# Patient Record
Sex: Female | Born: 1975 | Race: Black or African American | Hispanic: No | Marital: Married | Smoking: Never smoker
Health system: Southern US, Community
[De-identification: ages and names within clinical notes are randomized; demographics above are authoritative.]

## PROBLEM LIST (undated history)

## (undated) DIAGNOSIS — R5383 Other fatigue: Secondary | ICD-10-CM

## (undated) DIAGNOSIS — M2540 Effusion, unspecified joint: Secondary | ICD-10-CM

## (undated) DIAGNOSIS — L509 Urticaria, unspecified: Secondary | ICD-10-CM

## (undated) DIAGNOSIS — R351 Nocturia: Secondary | ICD-10-CM

## (undated) DIAGNOSIS — J302 Other seasonal allergic rhinitis: Secondary | ICD-10-CM

## (undated) DIAGNOSIS — E039 Hypothyroidism, unspecified: Secondary | ICD-10-CM

## (undated) DIAGNOSIS — G43909 Migraine, unspecified, not intractable, without status migrainosus: Secondary | ICD-10-CM

## (undated) DIAGNOSIS — D8689 Sarcoidosis of other sites: Secondary | ICD-10-CM

## (undated) DIAGNOSIS — Z8489 Family history of other specified conditions: Secondary | ICD-10-CM

## (undated) DIAGNOSIS — N946 Dysmenorrhea, unspecified: Secondary | ICD-10-CM

## (undated) DIAGNOSIS — E559 Vitamin D deficiency, unspecified: Secondary | ICD-10-CM

## (undated) DIAGNOSIS — G479 Sleep disorder, unspecified: Secondary | ICD-10-CM

## (undated) DIAGNOSIS — F32A Depression, unspecified: Secondary | ICD-10-CM

## (undated) DIAGNOSIS — L940 Localized scleroderma [morphea]: Secondary | ICD-10-CM

## (undated) DIAGNOSIS — M810 Age-related osteoporosis without current pathological fracture: Secondary | ICD-10-CM

## (undated) DIAGNOSIS — R21 Rash and other nonspecific skin eruption: Secondary | ICD-10-CM

## (undated) DIAGNOSIS — R0602 Shortness of breath: Secondary | ICD-10-CM

## (undated) DIAGNOSIS — D869 Sarcoidosis, unspecified: Secondary | ICD-10-CM

## (undated) DIAGNOSIS — J189 Pneumonia, unspecified organism: Secondary | ICD-10-CM

## (undated) DIAGNOSIS — Z8739 Personal history of other diseases of the musculoskeletal system and connective tissue: Secondary | ICD-10-CM

## (undated) DIAGNOSIS — K219 Gastro-esophageal reflux disease without esophagitis: Secondary | ICD-10-CM

## (undated) DIAGNOSIS — D649 Anemia, unspecified: Secondary | ICD-10-CM

## (undated) DIAGNOSIS — Z973 Presence of spectacles and contact lenses: Secondary | ICD-10-CM

## (undated) DIAGNOSIS — R011 Cardiac murmur, unspecified: Secondary | ICD-10-CM

## (undated) DIAGNOSIS — F329 Major depressive disorder, single episode, unspecified: Secondary | ICD-10-CM

## (undated) DIAGNOSIS — M2550 Pain in unspecified joint: Secondary | ICD-10-CM

## (undated) DIAGNOSIS — G373 Acute transverse myelitis in demyelinating disease of central nervous system: Secondary | ICD-10-CM

## (undated) DIAGNOSIS — T7840XA Allergy, unspecified, initial encounter: Secondary | ICD-10-CM

## (undated) DIAGNOSIS — N939 Abnormal uterine and vaginal bleeding, unspecified: Secondary | ICD-10-CM

## (undated) DIAGNOSIS — R51 Headache: Secondary | ICD-10-CM

## (undated) DIAGNOSIS — D509 Iron deficiency anemia, unspecified: Secondary | ICD-10-CM

## (undated) HISTORY — DX: Sleep disorder, unspecified: G47.9

## (undated) HISTORY — DX: Pain in unspecified joint: M25.50

## (undated) HISTORY — DX: Urticaria, unspecified: L50.9

## (undated) HISTORY — DX: Age-related osteoporosis without current pathological fracture: M81.0

## (undated) HISTORY — DX: Vitamin D deficiency, unspecified: E55.9

## (undated) HISTORY — PX: ESOPHAGOGASTRODUODENOSCOPY: SHX1529

## (undated) HISTORY — DX: Acute transverse myelitis in demyelinating disease of central nervous system: G37.3

## (undated) HISTORY — PX: LUNG BIOPSY: SHX232

## (undated) HISTORY — DX: Hypothyroidism, unspecified: E03.9

## (undated) HISTORY — DX: Allergy, unspecified, initial encounter: T78.40XA

## (undated) HISTORY — DX: Localized scleroderma (morphea): L94.0

## (undated) HISTORY — DX: Other fatigue: R53.83

---

## 1997-06-24 DIAGNOSIS — G373 Acute transverse myelitis in demyelinating disease of central nervous system: Secondary | ICD-10-CM

## 1997-06-24 HISTORY — DX: Acute transverse myelitis in demyelinating disease of central nervous system: G37.3

## 1999-01-02 ENCOUNTER — Encounter: Payer: Self-pay | Admitting: Emergency Medicine

## 1999-01-02 ENCOUNTER — Emergency Department (HOSPITAL_COMMUNITY): Admission: EM | Admit: 1999-01-02 | Discharge: 1999-01-02 | Payer: Self-pay | Admitting: Emergency Medicine

## 2003-10-26 ENCOUNTER — Other Ambulatory Visit: Admission: RE | Admit: 2003-10-26 | Discharge: 2003-10-26 | Payer: Self-pay | Admitting: Family Medicine

## 2004-06-12 ENCOUNTER — Ambulatory Visit: Payer: Self-pay | Admitting: Family Medicine

## 2004-07-05 ENCOUNTER — Ambulatory Visit: Payer: Self-pay | Admitting: Family Medicine

## 2004-10-17 ENCOUNTER — Ambulatory Visit: Payer: Self-pay | Admitting: Family Medicine

## 2004-11-07 ENCOUNTER — Other Ambulatory Visit: Admission: RE | Admit: 2004-11-07 | Discharge: 2004-11-07 | Payer: Self-pay | Admitting: Family Medicine

## 2004-11-07 ENCOUNTER — Ambulatory Visit: Payer: Self-pay | Admitting: Family Medicine

## 2004-11-16 ENCOUNTER — Ambulatory Visit: Payer: Self-pay | Admitting: Family Medicine

## 2005-01-28 ENCOUNTER — Ambulatory Visit: Payer: Self-pay | Admitting: Family Medicine

## 2005-04-18 ENCOUNTER — Ambulatory Visit: Payer: Self-pay | Admitting: Family Medicine

## 2005-07-23 ENCOUNTER — Ambulatory Visit: Payer: Self-pay | Admitting: Family Medicine

## 2011-06-25 DIAGNOSIS — D8689 Sarcoidosis of other sites: Secondary | ICD-10-CM

## 2011-06-25 DIAGNOSIS — D869 Sarcoidosis, unspecified: Secondary | ICD-10-CM

## 2011-06-25 DIAGNOSIS — Z862 Personal history of diseases of the blood and blood-forming organs and certain disorders involving the immune mechanism: Secondary | ICD-10-CM

## 2011-06-25 HISTORY — DX: Sarcoidosis of other sites: D86.89

## 2011-06-25 HISTORY — PX: LUNG BIOPSY: SHX232

## 2011-06-25 HISTORY — DX: Sarcoidosis, unspecified: D86.9

## 2011-06-25 HISTORY — DX: Personal history of diseases of the blood and blood-forming organs and certain disorders involving the immune mechanism: Z86.2

## 2011-10-23 DIAGNOSIS — D860 Sarcoidosis of lung: Secondary | ICD-10-CM

## 2011-10-23 HISTORY — DX: Sarcoidosis of lung: D86.0

## 2011-10-25 ENCOUNTER — Encounter: Payer: Self-pay | Admitting: Internal Medicine

## 2011-10-28 ENCOUNTER — Ambulatory Visit (INDEPENDENT_AMBULATORY_CARE_PROVIDER_SITE_OTHER): Payer: 59 | Admitting: Internal Medicine

## 2011-10-28 ENCOUNTER — Encounter: Payer: Self-pay | Admitting: Internal Medicine

## 2011-10-28 ENCOUNTER — Other Ambulatory Visit: Payer: 59

## 2011-10-28 VITALS — BP 106/72 | HR 72 | Temp 98.3°F | Ht 62.5 in | Wt 141.2 lb

## 2011-10-28 DIAGNOSIS — D869 Sarcoidosis, unspecified: Secondary | ICD-10-CM

## 2011-10-28 DIAGNOSIS — R002 Palpitations: Secondary | ICD-10-CM

## 2011-10-28 NOTE — Patient Instructions (Signed)
We are likely dealing with sarcoidosis Please have blood test called ACE level Please have CT chest with contrast   - evaluate for sarcoidosis  I will call you with results to plan next step AT somepoint need palpitations investigated

## 2011-10-28 NOTE — Progress Notes (Signed)
Subjective:    Patient ID: Mindy Brown, female    DOB: 09-Feb-1976, 36 y.o.   MRN: [id number]  HPI  36 year old AA female.  reports that she has never smoked. She does not have any smokeless tobacco history on file. Body mass index is 25.41 kg/(m^2). Referred by Dr Dierdre Forth for mediastinal nodes. PCP  Is Blane Ohara, MD    2 MRNs - 'Mindy Brown - MRN [id number] and Mindy Brown MRN [id number] in [facility] radiology)  IOV 10/28/2011  - 30 AA female. No family hx of asthma or sarcoid. Has dx of asthma since age 83. Reports 'heavy medication' for several years but after separation from smoking husband 5 years asthma improved and since then needing abluterol for rescue with last use 1 years ago. Then in April 2012 developed  pain, numbness, tingling in feet and hand  And pain in both elbows since April 2012 of insidious onset and progressive. Can be severe enough to wake her up or prevent her from being functional. Random occurences. Episodic. No clear cut aggravating factors. Sough help in MArch 2013 and PMD gave her mobic and this helped. Then referred to Dr Dierdre Forth over concerns of RA or SLE. She says this has been ruled out by Dr Dierdre Forth.   Then few months ago noticed insidious onset of dyspnea. Passed it off due to asthma. This is now progressive. Brought on by rest and exertion. No clear cut relieving symptoms but prednisone 2 week trial by Dr Dierdre Forth in early April 2012 helped dyspnea, and joint issues. Denies cough.   Of note, all these are associtaed with facial punctate skin changes since March 2013 that resolved with 2 week trial of prednisone but has recurred since then. Also, reports palpitations nos past few months and during these few seconds forgets to breathe. OF note, in Nov 2012: ER visit to [facility] due to arthralgia - heart murmur noticed    CXR 2000 under "C"hantell Spinks - clear CXR 09/23/11 under "S"hantell Spinks - bilateral and hilar mediastinal adenopathy     Past  Medical History  Diagnosis Date  . Asthma   . Polyarthralgia      Family History  Problem Relation Age of Onset  . Heart disease Maternal Grandfather      History   Social History  . Marital Status: Married    Spouse Name: N/A    Number of Children: N/A  . Years of Education: N/A   Occupational History  . scheduler    Social History Main Topics  . Smoking status: Never Smoker   . Smokeless tobacco: Not on file  . Alcohol Use: Yes     once or twice a week  . Drug Use: Not on file  . Sexually Active: Not on file   Other Topics Concern  . Not on file   Social History Narrative  . No narrative on file     No Known Allergies   Outpatient Prescriptions Prior to Visit  Medication Sig Dispense Refill  . Ferrous Sulfate (IRON) 325 (65 FE) MG TABS Take 1 tablet by mouth daily.      . meloxicam (MOBIC) 7.5 MG tablet Take 7.5 mg by mouth 2 (two) times daily.      . norgestimate-ethinyl estradiol (ORTHO-CYCLEN,SPRINTEC,PREVIFEM) 0.25-35 MG-MCG tablet Take 1 tablet by mouth daily.      . predniSONE (STERAPRED UNI-PAK) 5 MG TABS Take by mouth as directed.            Review of  Systems  Constitutional: Negative for fever and unexpected weight change.  HENT: Negative for ear pain, nosebleeds, congestion, sore throat, rhinorrhea, sneezing, trouble swallowing, dental problem, postnasal drip and sinus pressure.   Eyes: Negative for redness and itching.  Respiratory: Positive for shortness of breath. Negative for cough, chest tightness and wheezing.   Cardiovascular: Positive for chest pain and leg swelling. Negative for palpitations.  Gastrointestinal: Negative for nausea and vomiting.  Genitourinary: Negative for dysuria.  Musculoskeletal: Positive for joint swelling.  Skin: Negative for rash.  Neurological: Negative for headaches.  Hematological: Does not bruise/bleed easily.  Psychiatric/Behavioral: Negative for dysphoric mood. The patient is not nervous/anxious.         Objective:   Physical Exam  Vitals reviewed. Constitutional: She is oriented to person, place, and time. She appears well-developed and well-nourished. No distress.  HENT:  Head: Normocephalic and atraumatic.  Right Ear: External ear normal.  Left Ear: External ear normal.  Mouth/Throat: Oropharynx is clear and moist. No oropharyngeal exudate.  Eyes: Conjunctivae and EOM are normal. Pupils are equal, round, and reactive to light. Right eye exhibits no discharge. Left eye exhibits no discharge. No scleral icterus.  Neck: Normal range of motion. Neck supple. No JVD present. No tracheal deviation present. No thyromegaly present.  Cardiovascular: Normal rate, regular rhythm, normal heart sounds and intact distal pulses.  Exam reveals no gallop and no friction rub.   No murmur heard. Pulmonary/Chest: Effort normal and breath sounds normal. No respiratory distress. She has no wheezes. She has no rales. She exhibits no tenderness.  Abdominal: Soft. Bowel sounds are normal. She exhibits no distension and no mass. There is no tenderness. There is no rebound and no guarding.  Musculoskeletal: Normal range of motion. She exhibits no edema and no tenderness.  Lymphadenopathy:    She has no cervical adenopathy.  Neurological: She is alert and oriented to person, place, and time. She has normal reflexes. No cranial nerve deficit. She exhibits normal muscle tone. Coordination normal.  Skin: Skin is warm and dry. Rash noted. She is not diaphoretic. No erythema. No pallor.       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 Her behavior is normal. Judgment and thought content normal.          Assessment & Plan:

## 2011-10-29 LAB — ANGIOTENSIN CONVERTING ENZYME: Angiotensin-Converting Enzyme: >100 U/L — ABNORMAL HIGH (ref 8–52)

## 2011-10-31 ENCOUNTER — Encounter: Payer: Self-pay | Admitting: Internal Medicine

## 2011-10-31 ENCOUNTER — Telehealth: Payer: Self-pay | Admitting: Internal Medicine

## 2011-10-31 DIAGNOSIS — R002 Palpitations: Secondary | ICD-10-CM | POA: Insufficient documentation

## 2011-10-31 DIAGNOSIS — D869 Sarcoidosis, unspecified: Secondary | ICD-10-CM

## 2011-10-31 NOTE — Assessment & Plan Note (Addendum)
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     PLAN We are likely dealing with sarcoidosis: extensively counseled on disease, implications and Rx plan Please have blood test called ACE level Please have CT chest with contrast   - evaluate for sarcoidosis for diagnosis, staging that will help Rx plan I will call you with results to plan next step AT somepoint need palpitations investigated and PFT needed

## 2011-10-31 NOTE — Assessment & Plan Note (Signed)
Once  sarcpid dx is confirmed, will need to refer to cards to evaluate for cardiac sarcoid.

## 2011-10-31 NOTE — Telephone Encounter (Signed)
jen  I have odered for PFTs at [facility]; please coordinate  Thanks MR

## 2011-11-01 NOTE — Telephone Encounter (Signed)
[id number]. Pt also has lab test results as follows: Notes Recorded by Kalman Shan, MD on 10/29/2011 at 3:02 PM Let her know ACE > 100 and c/w sarcoid. I need to review CT chest next to decide on bx. CT is scheduled for 11-05-11 pt is already aware of this.  I need to advise her of results and the need for PFT. Carron Curie, CMA

## 2011-11-04 NOTE — Telephone Encounter (Signed)
Pt returned call re: results. Call [phone] Mindy Brown

## 2011-11-04 NOTE — Telephone Encounter (Signed)
Pt aware of lab results and pft scheduled here for 11-11-11 at 4pm. Carron Curie, CMA

## 2011-11-05 ENCOUNTER — Other Ambulatory Visit: Payer: 59

## 2011-11-07 ENCOUNTER — Ambulatory Visit (INDEPENDENT_AMBULATORY_CARE_PROVIDER_SITE_OTHER)
Admission: RE | Admit: 2011-11-07 | Discharge: 2011-11-07 | Disposition: A | Discharge status: Home / Self Care | Payer: 59 | Source: Ambulatory Visit | Attending: Internal Medicine | Admitting: Internal Medicine

## 2011-11-07 DIAGNOSIS — D869 Sarcoidosis, unspecified: Secondary | ICD-10-CM

## 2011-11-07 MED ORDER — IOHEXOL 300 MG/ML  SOLN
80.0000 mL | Freq: Once | INTRAMUSCULAR | Status: AC | PRN
  Administered 2011-11-07: 80 mL via INTRAVENOUS

## 2011-11-08 ENCOUNTER — Telehealth: Payer: Self-pay | Admitting: Internal Medicine

## 2011-11-08 DIAGNOSIS — D869 Sarcoidosis, unspecified: Secondary | ICD-10-CM

## 2011-11-08 NOTE — Telephone Encounter (Signed)
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 REsults given to patient. Needs EBUS with transbronchial biopsy. Risks of pneumothorax, hemothorax, sedation/anesthesia complications such as cardiac or respiratory arrest or hypotension, stroke and bleeding all explained. Benefits of diagnosis but limitations of non-diagnosis also explained. Patient verbalized understanding and wished to proceed.   Please set up EBUS with transbronchial biopsy under fluoroscopy for diagnosis of sarcoid, mediastinal nodes and pulmonary infiltrates  You can do morning of next week Tuesday5/21  or Wednesday 5/22  Or morning of 5/29 Tuesday  Or afternoon  of 5/28 Tuesday or 5/20 wednesday

## 2011-11-11 ENCOUNTER — Ambulatory Visit (INDEPENDENT_AMBULATORY_CARE_PROVIDER_SITE_OTHER): Payer: 59 | Admitting: Internal Medicine

## 2011-11-11 DIAGNOSIS — D869 Sarcoidosis, unspecified: Secondary | ICD-10-CM

## 2011-11-11 LAB — PULMONARY FUNCTION TEST

## 2011-11-11 NOTE — Progress Notes (Signed)
PFT done today. 

## 2011-11-11 NOTE — Telephone Encounter (Signed)
Order placed. Almyra Free will contact pt with time and date.Carron Curie, CMA

## 2011-11-12 ENCOUNTER — Telehealth: Payer: Self-pay | Admitting: Internal Medicine

## 2011-11-12 NOTE — Telephone Encounter (Signed)
LMOM TCB x1.  Pt had CT chest done 5.16.13 and PFTs done 5.20.13.

## 2011-11-12 NOTE — Telephone Encounter (Signed)
Please advise regarding CT results thanks

## 2011-11-12 NOTE — Telephone Encounter (Signed)
PFT 11/11/11   - moderate abnormalities c/w sarcoid. THe reason for PFT is to help [place] monitor Rx of sarcoid once we establish dx of sarcoid and start prednisone.   - EBUS - still no date. [facility] working on it  MR  PFTs  fvc 3.11/86%, fev1 1.6L/56%, BD 26%, TLC 97%, DLCO 16.5/74%

## 2011-11-12 NOTE — Telephone Encounter (Signed)
Pt returned call - she would like to know the results of her PFT and CT chest.  Is also wanting to know when her biopsy is to be scheduled.  Pt is aware MR is seeing pts this afternoon.  Dr Marchelle Gearing please advise, thanks.

## 2011-11-12 NOTE — Telephone Encounter (Signed)
i already gave the CT results the day after it was done (documented)- shows findings suggestive of sarcoid. It is baesd on those results we decided to get ebus. I myself called and gave her ct results  . I called her 5:50 PM   to give results personally but lmtcb but did state that we were workingon ebus date

## 2011-11-13 NOTE — Telephone Encounter (Signed)
Pt returned call for results- pls call her at [phone], thanks

## 2011-11-13 NOTE — Telephone Encounter (Signed)
Pt stated that she missed a call from MR regarding PFT results & has asked to be reached at [phone].  Mindy Brown

## 2011-11-13 NOTE — Telephone Encounter (Signed)
again called her and again and left message with details of results and plan that ebus date is still being worked on. Told her to call back if needed

## 2011-11-14 ENCOUNTER — Encounter (HOSPITAL_COMMUNITY): Payer: Self-pay | Admitting: Pharmacy Technician

## 2011-11-19 ENCOUNTER — Inpatient Hospital Stay (HOSPITAL_COMMUNITY): Admission: RE | Admit: 2011-11-19 | Discharge: 2011-11-19 | Payer: 59 | Source: Ambulatory Visit

## 2011-11-19 ENCOUNTER — Encounter: Payer: Self-pay | Admitting: Internal Medicine

## 2011-11-19 ENCOUNTER — Encounter (HOSPITAL_COMMUNITY): Payer: Self-pay | Admitting: *Deleted

## 2011-11-19 NOTE — Pre-Procedure Instructions (Signed)
20 Lamika D Spinks  11/19/2011   Your procedure is scheduled on:  Fri, May 31 @ 7:30 AM  Report to [facility] at 5:30 AM.  Call this number if you have problems the morning of surgery: [phone]   Remember:   Do not eat food:After Midnight.  May have clear liquids: up to 4 Hours before arrival.(until 1:30 AM)  Clear liquids include soda, tea, black coffee, apple or grape juice, broth,water  Take these medicines the morning of surgery with A SIP OF WATER:    Do not wear jewelry, make-up or nail polish.  Do not wear lotions, powders, or perfumes.   Do not shave 48 hours prior to surgery.   Do not bring valuables to the hospital.  Contacts, dentures or bridgework may not be worn into surgery.  Leave suitcase in the car. After surgery it may be brought to your room.  For patients admitted to the hospital, checkout time is 11:00 AM the day of discharge.   Patients discharged the day of surgery will not be allowed to drive home.  Special Instructions: CHG Shower Use Special Wash: 1/2 bottle night before surgery and 1/2 bottle morning of surgery.   Please read over the following fact sheets that you were given: Pain Booklet, Coughing and Deep Breathing and Surgical Site Infection Prevention

## 2011-11-19 NOTE — Progress Notes (Signed)
Pt doesn't have a cardiologist   Denies ever having a stress test/echo/heart cath  Medical MD is Nelly Rout at [facility] 2 Physician in [place]

## 2011-11-19 NOTE — Progress Notes (Signed)
Confirmed with pt that lab appointment is for 11/20/11 @ 3:30 PM

## 2011-11-20 ENCOUNTER — Encounter (HOSPITAL_COMMUNITY)
Admission: RE | Admit: 2011-11-20 | Discharge: 2011-11-20 | Disposition: A | Discharge status: Home / Self Care | Payer: 59 | Source: Ambulatory Visit | Attending: Internal Medicine | Admitting: Internal Medicine

## 2011-11-20 ENCOUNTER — Ambulatory Visit (HOSPITAL_COMMUNITY): Admission: RE | Admit: 2011-11-20 | Payer: 59 | Source: Ambulatory Visit

## 2011-11-20 LAB — CBC
MCH: 27 pg (ref 26.0–34.0)
Platelets: 267 10*3/uL (ref 150–400)
RBC: 3.74 MIL/uL — ABNORMAL LOW (ref 3.87–5.11)
WBC: 4.2 10*3/uL (ref 4.0–10.5)

## 2011-11-20 LAB — COMPREHENSIVE METABOLIC PANEL
ALT: 20 U/L (ref 0–35)
AST: 31 U/L (ref 0–37)
Alkaline Phosphatase: 43 U/L (ref 39–117)
CO2: 26 mEq/L (ref 19–32)
Calcium: 9.1 mg/dL (ref 8.4–10.5)
GFR calc non Af Amer: >90 mL/min (ref >90)
Potassium: 4.2 mEq/L (ref 3.5–5.1)
Sodium: 136 mEq/L (ref 135–145)

## 2011-11-20 LAB — SURGICAL PCR SCREEN
MRSA, PCR: NEGATIVE
Staphylococcus aureus: NEGATIVE

## 2011-11-20 LAB — APTT: aPTT: 27 seconds (ref 24–37)

## 2011-11-20 LAB — HCG, SERUM, QUALITATIVE: Preg, Serum: NEGATIVE

## 2011-11-22 ENCOUNTER — Encounter (HOSPITAL_COMMUNITY)
Admission: RE | Disposition: A | Discharge status: Home / Self Care | Payer: Self-pay | Source: Ambulatory Visit | Attending: Internal Medicine

## 2011-11-22 ENCOUNTER — Encounter (HOSPITAL_COMMUNITY): Payer: Self-pay | Admitting: Certified Registered Nurse Anesthetist

## 2011-11-22 ENCOUNTER — Ambulatory Visit (HOSPITAL_COMMUNITY): Payer: 59

## 2011-11-22 ENCOUNTER — Encounter (HOSPITAL_COMMUNITY): Payer: Self-pay | Admitting: *Deleted

## 2011-11-22 ENCOUNTER — Ambulatory Visit (HOSPITAL_COMMUNITY): Payer: 59 | Admitting: Certified Registered Nurse Anesthetist

## 2011-11-22 ENCOUNTER — Ambulatory Visit (HOSPITAL_COMMUNITY)
Admission: RE | Admit: 2011-11-22 | Discharge: 2011-11-22 | Disposition: A | Discharge status: Home / Self Care | Payer: 59 | Source: Ambulatory Visit | Attending: Internal Medicine | Admitting: Internal Medicine

## 2011-11-22 ENCOUNTER — Encounter (HOSPITAL_COMMUNITY): Payer: Self-pay | Admitting: Internal Medicine

## 2011-11-22 DIAGNOSIS — D869 Sarcoidosis, unspecified: Secondary | ICD-10-CM

## 2011-11-22 DIAGNOSIS — Z01812 Encounter for preprocedural laboratory examination: Secondary | ICD-10-CM | POA: Insufficient documentation

## 2011-11-22 DIAGNOSIS — Z01818 Encounter for other preprocedural examination: Secondary | ICD-10-CM | POA: Insufficient documentation

## 2011-11-22 DIAGNOSIS — R590 Localized enlarged lymph nodes: Secondary | ICD-10-CM | POA: Diagnosis present

## 2011-11-22 DIAGNOSIS — R599 Enlarged lymph nodes, unspecified: Secondary | ICD-10-CM

## 2011-11-22 DIAGNOSIS — R918 Other nonspecific abnormal finding of lung field: Secondary | ICD-10-CM

## 2011-11-22 DIAGNOSIS — Z01810 Encounter for preprocedural cardiovascular examination: Secondary | ICD-10-CM | POA: Insufficient documentation

## 2011-11-22 DIAGNOSIS — J45909 Unspecified asthma, uncomplicated: Secondary | ICD-10-CM | POA: Insufficient documentation

## 2011-11-22 HISTORY — DX: Gastro-esophageal reflux disease without esophagitis: K21.9

## 2011-11-22 HISTORY — DX: Anemia, unspecified: D64.9

## 2011-11-22 HISTORY — DX: Shortness of breath: R06.02

## 2011-11-22 HISTORY — PX: BRONCHOSCOPY: SUR163

## 2011-11-22 HISTORY — DX: Nocturia: R35.1

## 2011-11-22 HISTORY — DX: Pneumonia, unspecified organism: J18.9

## 2011-11-22 HISTORY — DX: Pain in unspecified joint: M25.50

## 2011-11-22 HISTORY — DX: Headache: R51

## 2011-11-22 HISTORY — DX: Other seasonal allergic rhinitis: J30.2

## 2011-11-22 HISTORY — DX: Cardiac murmur, unspecified: R01.1

## 2011-11-22 HISTORY — DX: Sarcoidosis, unspecified: D86.9

## 2011-11-22 HISTORY — DX: Effusion, unspecified joint: M25.40

## 2011-11-22 HISTORY — DX: Depression, unspecified: F32.A

## 2011-11-22 HISTORY — DX: Major depressive disorder, single episode, unspecified: F32.9

## 2011-11-22 HISTORY — DX: Rash and other nonspecific skin eruption: R21

## 2011-11-22 LAB — PATHOLOGIST SMEAR REVIEW

## 2011-11-22 LAB — BODY FLUID CELL COUNT WITH DIFFERENTIAL
Eos, Fluid: 2 %
Lymphs, Fluid: 27 %
Monocyte-Macrophage-Serous Fluid: 71 % (ref 50–90)

## 2011-11-22 SURGERY — BRONCHOSCOPY, WITH EBUS
Anesthesia: General | Site: Mouth | Wound class: Clean Contaminated

## 2011-11-22 MED ORDER — HYDROMORPHONE HCL PF 1 MG/ML IJ SOLN: 0.2500 mg | INTRAMUSCULAR | Status: DC | PRN

## 2011-11-22 MED ORDER — PROPOFOL 10 MG/ML IV EMUL
INTRAVENOUS | Status: DC | PRN
  Administered 2011-11-22: 180 mg via INTRAVENOUS
  Administered 2011-11-22 (×2): 20 mg via INTRAVENOUS

## 2011-11-22 MED ORDER — FENTANYL CITRATE 0.05 MG/ML IJ SOLN
INTRAMUSCULAR | Status: DC | PRN
  Administered 2011-11-22 (×3): 50 ug via INTRAVENOUS

## 2011-11-22 MED ORDER — 0.9 % SODIUM CHLORIDE (POUR BTL) OPTIME
TOPICAL | Status: DC | PRN
  Administered 2011-11-22: 1000 mL

## 2011-11-22 MED ORDER — ONDANSETRON HCL 4 MG/2ML IJ SOLN
INTRAMUSCULAR | Status: DC | PRN
  Administered 2011-11-22: 4 mg via INTRAVENOUS

## 2011-11-22 MED ORDER — LIDOCAINE HCL 4 % MT SOLN
OROMUCOSAL | Status: DC | PRN
  Administered 2011-11-22: 4 mL via TOPICAL

## 2011-11-22 MED ORDER — ROCURONIUM BROMIDE 100 MG/10ML IV SOLN
INTRAVENOUS | Status: DC | PRN
  Administered 2011-11-22: 25 mg via INTRAVENOUS

## 2011-11-22 MED ORDER — LACTATED RINGERS IV SOLN
INTRAVENOUS | Status: DC | PRN
  Administered 2011-11-22: 07:00:00 via INTRAVENOUS

## 2011-11-22 MED ORDER — ONDANSETRON HCL 4 MG/2ML IJ SOLN
4.0000 mg | Freq: Once | INTRAMUSCULAR | Status: AC | PRN
  Administered 2011-11-22: 4 mg via INTRAVENOUS

## 2011-11-22 MED ORDER — MIDAZOLAM HCL 5 MG/5ML IJ SOLN
INTRAMUSCULAR | Status: DC | PRN
  Administered 2011-11-22: 2 mg via INTRAVENOUS

## 2011-11-22 MED ORDER — LIDOCAINE HCL (CARDIAC) 20 MG/ML IV SOLN
INTRAVENOUS | Status: DC | PRN
  Administered 2011-11-22: 60 mg via INTRAVENOUS

## 2011-11-22 MED ORDER — NEOSTIGMINE METHYLSULFATE 1 MG/ML IJ SOLN
INTRAMUSCULAR | Status: DC | PRN
  Administered 2011-11-22: 3 mg via INTRAVENOUS

## 2011-11-22 MED ORDER — GLYCOPYRROLATE 0.2 MG/ML IJ SOLN
INTRAMUSCULAR | Status: DC | PRN
  Administered 2011-11-22: 0.4 mg via INTRAVENOUS

## 2011-11-22 SURGICAL SUPPLY — 22 items
BRUSH CYTOL CELLEBRITY 1.5X140 (MISCELLANEOUS) IMPLANT
CANISTER SUCTION 2500CC (MISCELLANEOUS) ×2 IMPLANT
CLOTH BEACON ORANGE TIMEOUT ST (SAFETY) ×2 IMPLANT
CONT SPEC 4OZ CLIKSEAL STRL BL (MISCELLANEOUS) ×4 IMPLANT
COVER TABLE BACK 60X90 (DRAPES) ×2 IMPLANT
FORCEPS BIOP RJ4 1.8 (CUTTING FORCEPS) IMPLANT
GLOVE SURG SIGNA 7.5 PF LTX (GLOVE) ×2 IMPLANT
GOWN EXTRA PROTECTION XL (GOWNS) ×2 IMPLANT
KIT ROOM TURNOVER OR (KITS) ×2 IMPLANT
MARKER SKIN DUAL TIP RULER LAB (MISCELLANEOUS) ×2 IMPLANT
NEEDLE BIOPSY TRANSBRONCH 21G (NEEDLE) IMPLANT
NEEDLE SYS SONOTIP II EBUSTBNA (NEEDLE) ×2 IMPLANT
NS IRRIG 1000ML POUR BTL (IV SOLUTION) ×2 IMPLANT
OIL SILICONE PENTAX (PARTS (SERVICE/REPAIRS)) ×2 IMPLANT
PAD ARMBOARD 7.5X6 YLW CONV (MISCELLANEOUS) ×4 IMPLANT
SPONGE GAUZE 4X4 12PLY (GAUZE/BANDAGES/DRESSINGS) ×2 IMPLANT
SYR 20CC LL (SYRINGE) ×2 IMPLANT
SYR 20ML ECCENTRIC (SYRINGE) ×4 IMPLANT
SYR 5ML LUER SLIP (SYRINGE) ×2 IMPLANT
TOWEL OR 17X24 6PK STRL BLUE (TOWEL DISPOSABLE) IMPLANT
TRAP SPECIMEN MUCOUS 40CC (MISCELLANEOUS) ×4 IMPLANT
TUBE CONNECTING 12X1/4 (SUCTIONS) ×2 IMPLANT

## 2011-11-22 NOTE — Interval H&P Note (Signed)
Risks of pneumothorax, hemothorax, sedation/anesthesia complications such as cardiac or respiratory arrest or hypotension, stroke and bleeding all explained. Benefits of diagnosis but limitations of non-diagnosis also explained. Patient verbalized understanding and wished to proceed.   

## 2011-11-22 NOTE — Discharge Instructions (Signed)
Please have someone to drive you home  Please be careful with activities for next 24 hours  You can eat 2-4 hours after getting home provided you are fully alert, able to cough, and are not nauseated or vomiting and     feel well  You are expected to have low grade fever or cough some amount of blood for next 24-48 hours; if this worsens call us  IF you are very short of breath or coughing blood or chest pain or not feeling well, call us [phone] anytime or go to emergency room  Please call [phone] in 3- 4 days for results or we will call you. No followup scheduled at this point. IT depends on results

## 2011-11-22 NOTE — Interval H&P Note (Signed)
Pre op history 11/22/2011 at 7:19 AM : In interim since office visit no new problems. She is midly anxious about procedure today but denies dyspnea, cough, chest pain, edema, hemoptysis, fever, chills, sputum that is currently active. NPO confirmed  No change in exam  Lab 11/20/11 1626  HGB 10.1*  HCT 30.2*  WBC 4.2  PLT 267    Lab 11/20/11 1626  NA 136  K 4.2  CL 101  CO2 26  GLUCOSE 100*  BUN 14  CREATININE 0.79  CALCIUM 9.1  MG --  PHOS --   EKG - normal  OK to undergo procedure

## 2011-11-22 NOTE — Anesthesia Procedure Notes (Addendum)
Procedure Name: Intubation Date/Time: 11/22/2011 7:40 AM Performed by: Margaree Mackintosh Pre-anesthesia Checklist: Patient identified, Timeout performed, Emergency Drugs available, Suction available and Patient being monitored Patient Re-evaluated:Patient Re-evaluated prior to inductionOxygen Delivery Method: Circle system utilized Preoxygenation: Pre-oxygenation with 100% oxygen Intubation Type: IV induction Ventilation: Mask ventilation without difficulty Laryngoscope Size: Mac and 3 Grade View: Grade I Tube type: Oral Tube size: 8.5 mm Number of attempts: 2 (LTA placed 1st DL then intubation on 2nd DL) Airway Equipment and Method: Stylet Placement Confirmation: ETT inserted through vocal cords under direct vision,  positive ETCO2 and breath sounds checked- equal and bilateral Secured at: 21 cm Tube secured with: Tape Dental Injury: Teeth and Oropharynx as per pre-operative assessment

## 2011-11-22 NOTE — Op Note (Signed)
Name:  Mindy Brown MRN:  [id number] DOB:  12-24-1975  PROCEDURE NOTE  Procedure(s): Flexible bronchoscopy (40981) Bronchial alveolar lavage (19147) of the LINGULA Endobronchial biopsy (82956) of the LEFT UPPER LOBE AIRWAY AND NODULE X 1 (MIXED WITH TRANSBRONCHIAL BIOPSY OF LINGULA) Transbronchial lung biopsy, single lobe (21308) of the LINGULA Endobronchial ultrasound (65784) Transbronchial needle aspiration (69629) of the STATION 7 AND STATION 10L   Indications:  Hilar / mediastinal lymphadenopathy.  Consent:  Procedure, benefits, risks and alternatives discussed.  Questions answered.  Consent obtained.  Anesthesia:  General endotracheal.  Procedure summary:  Appropriate equipment was assembled.  The patient was brought to the operating room and identified as Mindy Brown.  Safety timeout was performed. The patient was placed supine on the operating table, airway established and general anesthesia administered by Anesthesia team.   After the appropriate level of anesthesia was assured, flexible video bronchoscope was lubricated and inserted through the endotracheal tube.    Airway examination was performed bilaterally to subsegmental level.  Minimal clear secretions were noted, mucosa appeared normal EXCEPT IN LEFT UPPER LOBE AND LINGULA THERE WAS SCATTERED FEW NODULES IN THE AIRWAY EVEN LEADING INTO THE SUBSEGMENTS. Otherwise no endobronchial lesions were identified.  Bronchial alveolar lavage of the  LINGULAR was performed with 120 mL of normal saline and return of 45 mL of CLEAR GRAY FLUID, after which the bronchoscope was withdrawn.  Endobronchial ultrasound video bronchoscope was then lubricated and inserted through the endotracheal tube. Surveillance of the mediastinal and and bilateral hilar lymph node stations was performed.  Pathologically enlarged lymph nodes were noted AND FOUND TO BE IN STATION 7, STATION 10L AND STATION 10R. THE STATION 4R NODE SEEN ON CT WAS NOT  EXAMINED BY EBUS  Endobronchial ultrasound guided transbronchial needle aspiration of STATION 7 (passes 4), STATION 10L (passes 2) WAS performed, after which EBUS bronchoscope was withdrawn. DR KISH CALLED WITH PRELIMINARY RESULTS SUGGESTIVE OF BENIGN NODE BUT WITH POSSIBLE AND LIKELY PRESENCE OF GRANULOMA BUT REQUESTED MORE TISSUE  Flexible video bronchoscope was used again to perform ENDOBRONCHIAL BIOPSY OF THE NODULE IN LEFT UPPER LOBE SECONDARY [place]. AFTER THIS TRANSBRONCHIAL BIOPSY OF THE LINGULA  INFERIOR SEGMENT WAS DONE X 4 . NOTE FLUOROSCOPY WAS NOT AVAILABLE FOR > 5 MINUTES SO THESE BIOPSIES WERE BLIND.  After hemostasis was assure, the bronchoscope was withdrawn.  The patient was extubated in operating room and transferred to PACU. Post-procedure chest x-ray was ordered.  Specimens sent: Bronchial alveolar lavage specimen of the LINGULAR for cell count (including CD4/CD8 assessment), microbiology and cytology.  Complications:  No immediate complications were noted.  Hemodynamic parameters and oxygenation remained stable throughout the procedure.  Estimated blood loss:  Less then 1 mL.  IMPRESSION  1. MEDIASTINAL NODES 2. PULMONARY INFILTRATES 3. SUSPECTED SARCOID  Dr. Kalman Shan, M.D., [facility].C.P Pulmonary and Critical Care Medicine Staff Physician [facility] Pager: [phone], If no answer or between  15:00h - 7:00h: call [phone]  11/22/2011 8:33 AM

## 2011-11-22 NOTE — Transfer of Care (Signed)
Immediate Anesthesia Transfer of Care Note  Patient: Mindy Brown  Procedure(s) Performed: Procedure(s) (LRB): VIDEO BRONCHOSCOPY WITH ENDOBRONCHIAL ULTRASOUND (N/A)  Patient Location: PACU  Anesthesia Type: General  Level of Consciousness: awake and alert   Airway & Oxygen Therapy: Patient Spontanous Breathing and Patient connected to nasal cannula oxygen  Post-op Assessment: Report given to PACU RN and Post -op Vital signs reviewed and stable  Post vital signs: Reviewed and stable  Complications: No apparent anesthesia complications

## 2011-11-22 NOTE — H&P (View-Only) (Signed)
Subjective:    Patient ID: Mindy Brown, female    DOB: 02/08/1976, 35 y.o.   MRN: [id number]  HPI  35 year old AA female.  reports that she has never smoked. She does not have any smokeless tobacco history on file. Body mass index is 25.41 kg/(m^2). Referred by Dr Beekman for mediastinal nodes. PCP  Is COX,KIRSTEN, MD    2 MRNs - 'Mindy Brown - MRN [id number] and ('Mindy Brown MRN [id number] in [place] radiology)  IOV 10/28/2011  - 35 AA female. No family hx of asthma or sarcoid. Has dx of asthma since age 22. Reports 'heavy medication' for several years but after separation from smoking husband 5 years asthma improved and since then needing abluterol for rescue with last use 1 years ago. Then in April 2012 developed  pain, numbness, tingling in feet and hand  And pain in both elbows since April 2012 of insidious onset and progressive. Can be severe enough to wake her up or prevent her from being functional. Random occurences. Episodic. No clear cut aggravating factors. Sough help in MArch 2013 and PMD gave her mobic and this helped. Then referred to Dr Beekman over concerns of RA or SLE. She says this has been ruled out by Dr Beekman.   Then few months ago noticed insidious onset of dyspnea. Passed it off due to asthma. This is now progressive. Brought on by rest and exertion. No clear cut relieving symptoms but prednisone 2 week trial by Dr Beekman in early April 2012 helped dyspnea, and joint issues. Denies cough.   Of note, all these are associtaed with facial punctate skin changes since March 2013 that resolved with 2 week trial of prednisone but has recurred since then. Also, reports palpitations nos past few months and during these few seconds forgets to breathe. OF note, in Nov 2012: ER visit to [facility] due to arthralgia - heart murmur noticed    CXR 2000 under "C"hantell Brown - clear CXR 09/23/11 under "S"hantell Brown - bilateral and hilar mediastinal adenopathy     Past  Medical History  Diagnosis Date  . Asthma   . Polyarthralgia      Family History  Problem Relation Age of Onset  . Heart disease Maternal Grandfather      History   Social History  . Marital Status: Married    Spouse Name: N/A    Number of Children: N/A  . Years of Education: N/A   Occupational History  . scheduler    Social History Main Topics  . Smoking status: Never Smoker   . Smokeless tobacco: Not on file  . Alcohol Use: Yes     once or twice a week  . Drug Use: Not on file  . Sexually Active: Not on file   Other Topics Concern  . Not on file   Social History Narrative  . No narrative on file     No Known Allergies   Outpatient Prescriptions Prior to Visit  Medication Sig Dispense Refill  . Ferrous Sulfate (IRON) 325 (65 FE) MG TABS Take 1 tablet by mouth daily.      . meloxicam (MOBIC) 7.5 MG tablet Take 7.5 mg by mouth 2 (two) times daily.      . norgestimate-ethinyl estradiol (ORTHO-CYCLEN,SPRINTEC,PREVIFEM) 0.25-35 MG-MCG tablet Take 1 tablet by mouth daily.      . predniSONE (STERAPRED UNI-PAK) 5 MG TABS Take by mouth as directed.            Review of   Systems  Constitutional: Negative for fever and unexpected weight change.  HENT: Negative for ear pain, nosebleeds, congestion, sore throat, rhinorrhea, sneezing, trouble swallowing, dental problem, postnasal drip and sinus pressure.   Eyes: Negative for redness and itching.  Respiratory: Positive for shortness of breath. Negative for cough, chest tightness and wheezing.   Cardiovascular: Positive for chest pain and leg swelling. Negative for palpitations.  Gastrointestinal: Negative for nausea and vomiting.  Genitourinary: Negative for dysuria.  Musculoskeletal: Positive for joint swelling.  Skin: Negative for rash.  Neurological: Negative for headaches.  Hematological: Does not bruise/bleed easily.  Psychiatric/Behavioral: Negative for dysphoric mood. The patient is not nervous/anxious.         Objective:   Physical Exam  Vitals reviewed. Constitutional: She is oriented to person, place, and time. She appears well-developed and well-nourished. No distress.  HENT:  Head: Normocephalic and atraumatic.  Right Ear: External ear normal.  Left Ear: External ear normal.  Mouth/Throat: Oropharynx is clear and moist. No oropharyngeal exudate.  Eyes: Conjunctivae and EOM are normal. Pupils are equal, round, and reactive to light. Right eye exhibits no discharge. Left eye exhibits no discharge. No scleral icterus.  Neck: Normal range of motion. Neck supple. No JVD present. No tracheal deviation present. No thyromegaly present.  Cardiovascular: Normal rate, regular rhythm, normal heart sounds and intact distal pulses.  Exam reveals no gallop and no friction rub.   No murmur heard. Pulmonary/Chest: Effort normal and breath sounds normal. No respiratory distress. She has no wheezes. She has no rales. She exhibits no tenderness.  Abdominal: Soft. Bowel sounds are normal. She exhibits no distension and no mass. There is no tenderness. There is no rebound and no guarding.  Musculoskeletal: Normal range of motion. She exhibits no edema and no tenderness.  Lymphadenopathy:    She has no cervical adenopathy.  Neurological: She is alert and oriented to person, place, and time. She has normal reflexes. No cranial nerve deficit. She exhibits normal muscle tone. Coordination normal.  Skin: Skin is warm and dry. Rash noted. She is not diaphoretic. No erythema. No pallor.       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 Her behavior is normal. Judgment and thought content normal.          Assessment & Plan:   

## 2011-11-22 NOTE — Anesthesia Postprocedure Evaluation (Signed)
  Anesthesia Post-op Note  Patient: Mindy Brown  Procedure(s) Performed: Procedure(s) (LRB): VIDEO BRONCHOSCOPY WITH ENDOBRONCHIAL ULTRASOUND (N/A)  Patient Location: PACU  Anesthesia Type: General  Level of Consciousness: awake, oriented, sedated and patient cooperative  Airway and Oxygen Therapy: Patient Spontanous Breathing and Patient connected to nasal cannula oxygen  Post-op Pain: mild  Post-op Assessment: Post-op Vital signs reviewed, Patient's Cardiovascular Status Stable, Respiratory Function Stable, Patent Airway, No signs of Nausea or vomiting and Pain level controlled  Post-op Vital Signs: stable  Complications: No apparent anesthesia complications

## 2011-11-22 NOTE — Preoperative (Signed)
Beta Blockers   Reason not to administer Beta Blockers:Not Applicable 

## 2011-11-22 NOTE — Anesthesia Preprocedure Evaluation (Signed)
Anesthesia Evaluation  Patient identified by MRN, date of birth, ID band Patient awake    Reviewed: Allergy & Precautions, H&P , NPO status , Patient's Chart, lab work & pertinent test results  Airway Mallampati: I TM Distance: >3 FB Neck ROM: full    Dental   Pulmonary shortness of breath, asthma ,          Cardiovascular Rhythm:regular Rate:Normal     Neuro/Psych  Headaches, PSYCHIATRIC DISORDERS    GI/Hepatic GERD-  ,  Endo/Other    Renal/GU      Musculoskeletal   Abdominal   Peds  Hematology   Anesthesia Other Findings   Reproductive/Obstetrics                           Anesthesia Physical Anesthesia Plan  ASA: II  Anesthesia Plan: General   Post-op Pain Management:    Induction: Intravenous  Airway Management Planned: Oral ETT  Additional Equipment:   Intra-op Plan:   Post-operative Plan: Extubation in OR  Informed Consent: I have reviewed the patients History and Physical, chart, labs and discussed the procedure including the risks, benefits and alternatives for the proposed anesthesia with the patient or authorized representative who has indicated his/her understanding and acceptance.     Plan Discussed with: CRNA, Anesthesiologist and Surgeon  Anesthesia Plan Comments:         Anesthesia Quick Evaluation

## 2011-11-24 LAB — CULTURE, RESPIRATORY W GRAM STAIN
Culture: NO GROWTH
Gram Stain: NONE SEEN

## 2011-11-25 ENCOUNTER — Telehealth: Payer: Self-pay | Admitting: Internal Medicine

## 2011-11-25 MED ORDER — PREDNISONE 5 MG PO TABS: 5.0000 mg | ORAL_TABLET | Freq: Every day | ORAL | Status: DC

## 2011-11-25 MED ORDER — PREDNISONE 10 MG PO TABS: ORAL_TABLET | ORAL | Status: DC

## 2011-11-25 NOTE — Telephone Encounter (Signed)
    5/31/113 bronch results. I gave results to patient   BAL and TBBx lingular - non diagnostic  EBUS Station 7 and 10L nodes show granuloma. 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

## 2011-11-25 NOTE — Telephone Encounter (Signed)
Sent both Pred Rx to [facility], Left msg on pts vmail to return call to schedule 4 week appt. Pt needs

## 2011-11-27 NOTE — Telephone Encounter (Signed)
LMTCBx2. Teofilo Lupinacci, CMA  

## 2011-11-29 NOTE — Telephone Encounter (Signed)
Pt returned call from [place].  Call back at [phone] Leanora Ivanoff

## 2011-11-29 NOTE — Telephone Encounter (Signed)
appt scheduled for 12-20-11 at 10:45.Carron Curie, CMA

## 2011-11-29 NOTE — Telephone Encounter (Signed)
LMTCBx2. Mindy Brown, CMA  

## 2011-12-16 ENCOUNTER — Telehealth: Payer: Self-pay | Admitting: Internal Medicine

## 2011-12-16 DIAGNOSIS — G629 Polyneuropathy, unspecified: Secondary | ICD-10-CM

## 2011-12-16 NOTE — Telephone Encounter (Signed)
Pt stated that since switching to the lower dosage of prednisone, she has noticed more pain & numbness in her legs.  Much more so than while taking the 20 mg or 30 mg of prednisone.  Please advise.  Mindy Brown

## 2011-12-16 NOTE — Telephone Encounter (Signed)
lmomtcb x1 

## 2011-12-17 NOTE — Telephone Encounter (Signed)
Spoke with pt. She states that since she decreased prednisone to 10 mg 6 days ago, she is having increased pain and numbness in her legs. She states that this is the problem that she was having before starting on med, and wants to know if she can increase prednisone back up to 20 or 30 mg for a few wks. Please advise thanks!

## 2011-12-17 NOTE — Telephone Encounter (Signed)
I am oon extension [phone] till 17.30. Patient can call here direct. I tried to call her direct at both cell and home number went to voice mail. I want to discuss with her direct

## 2011-12-17 NOTE — Telephone Encounter (Signed)
spokle to patient.  - cough and dyspnea gone/much improved   - but neuropathy worse when tapered down to10mg  per day.  PLAN  - hold off on tapering to 5mg  per day prednisone  - continue prednisone at 10mg  per day  - keep fu appt with me with spirometry at followup  - refer neurology Dr Modesto Charon at [facility] or Dr Terrace Arabia at [facility] (order done)  Thanks  MR

## 2011-12-17 NOTE — Telephone Encounter (Signed)
Called, spoke with pt.  She will call MR at below # prior to 5:30 pm.  Will forward to his box so he is aware.

## 2011-12-20 ENCOUNTER — Encounter: Payer: Self-pay | Admitting: Internal Medicine

## 2011-12-20 ENCOUNTER — Ambulatory Visit (INDEPENDENT_AMBULATORY_CARE_PROVIDER_SITE_OTHER): Payer: 59 | Admitting: Internal Medicine

## 2011-12-20 VITALS — BP 118/78 | HR 87 | Temp 98.3°F | Ht 62.0 in | Wt 141.2 lb

## 2011-12-20 DIAGNOSIS — R002 Palpitations: Secondary | ICD-10-CM

## 2011-12-20 DIAGNOSIS — G63 Polyneuropathy in diseases classified elsewhere: Secondary | ICD-10-CM

## 2011-12-20 DIAGNOSIS — D8689 Sarcoidosis of other sites: Secondary | ICD-10-CM

## 2011-12-20 DIAGNOSIS — D869 Sarcoidosis, unspecified: Secondary | ICD-10-CM

## 2011-12-20 MED ORDER — PREDNISONE 10 MG PO TABS: ORAL_TABLET | ORAL | Status: DC

## 2011-12-20 NOTE — Progress Notes (Signed)
  Subjective:    Patient ID: Mindy Brown, female    DOB: January 16, 1976, 36 y.o.   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 EBUS diagnosis 11/21/11 - Commenced prednisone 6/43/13  OV 12/20/2011  Followup for above. Started on prednisone 30mg  per day and quick taper down to 10mg  per day with plan to start 5mg  per day today. Reports that while on 30 and 20mg  per day did really well with resolution of lupus pernio on face, reduction in palpitaitons and chest tightness and cough and all B symptoms and overall sense of well being. Hoewver, when dropped to 10mg  per day, lupur pernio rash back and some increased chest tightness.   In addition, she is describing recurrence of bilateral ankle arthropathy with  biltaeral glove and stocking numbness with gnawing pain in distal feet since dropping prednisone dose to 10mg  per day. Neuro appt is pending  Also, of note palpitations though better still persist. They are random, brief and sometimes very intense. No associtaed caffeine intake  Review of Systems  Constitutional: Negative.   HENT: Negative.   Eyes: Negative.   Respiratory: Positive for chest tightness. Negative for apnea, cough, choking, shortness of breath, wheezing and stridor.   Cardiovascular: Positive for chest pain and palpitations. Negative for leg swelling.  Gastrointestinal: Negative.   Genitourinary: Negative.   Musculoskeletal: Positive for myalgias and arthralgias. Negative for back pain, joint swelling and gait problem.  Skin: Positive for color change and rash. Negative for pallor and wound.  Neurological: Positive for numbness. Negative for dizziness, tremors, seizures, syncope, facial asymmetry, speech difficulty, weakness, light-headedness and headaches.  Hematological: Negative.   Psychiatric/Behavioral: Negative.        Objective:   Physical Exam  Vitals reviewed. Constitutional: She is oriented to person, place, and time. She appears well-developed and well-nourished. No distress.  HENT:  Head: Normocephalic and atraumatic.  Right Ear: External ear normal.  Left Ear: External ear normal.  Mouth/Throat: Oropharynx is clear and moist. No oropharyngeal exudate.  Eyes: Conjunctivae and EOM are normal. Pupils are equal, round, and reactive to light. Right eye exhibits no discharge. Left eye exhibits no discharge. No scleral icterus.  Neck: Normal range of motion. Neck supple. No JVD present. No tracheal deviation present. No thyromegaly present.  Cardiovascular: Normal rate, regular rhythm, normal heart sounds and intact distal pulses.  Exam reveals no gallop and no friction rub.   No murmur heard. Pulmonary/Chest: Effort normal and breath sounds normal. No respiratory distress. She has no wheezes. She has no rales. She exhibits no tenderness.  Abdominal: Soft. Bowel sounds are normal. She exhibits no distension and no mass. There is no tenderness. There is no rebound and no guarding.  Musculoskeletal: Normal range of motion. She exhibits no edema and no tenderness.  Lymphadenopathy:    She has no cervical adenopathy.  Neurological: She is alert and oriented to person, place, and time. She has normal reflexes. No cranial nerve deficit. She exhibits normal muscle tone. Coordination normal.  Skin: Skin is warm and dry. Rash noted. She is not diaphoretic. No erythema. No pallor.       lupuse pernio all over face  Psychiatric: She has a normal mood and affect. Her behavior is normal. Judgment and thought content normal.          Assessment & Plan:

## 2011-12-20 NOTE — Assessment & Plan Note (Signed)
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 Seems to correlate with sarcoid. Will get neuro opinion; referral already done

## 2011-12-20 NOTE — Assessment & Plan Note (Signed)
#  Neuropathy in feet  - see neurology (referral already done)  #Pulmonary and skin sarcoid  - glad you felt better at higher dose of prednisone but skin lesions have recurred at lower dose  - so, increase prednisone from 10mg  daily to 30mg  daily. Take 30mg  daily for 3 weeks, then take 20mg  daily to continue daily (nurse will send script)  #followup  - august 2013 with spirometry at followup  - any problems call sooner

## 2011-12-20 NOTE — Assessment & Plan Note (Signed)
#  palpitations  - we need to ensure that there is no heart involvement with sarcoid; please see Dr Marca Ancona of [facility]

## 2011-12-20 NOTE — Patient Instructions (Addendum)
#  Neuropathy in feet  - see neurology (referral already done)  #Pulmonary and skin sarcoid  - glad you felt better at higher dose of prednisone but skin lesions have recurred at lower dose  - so, increase prednisone from 10mg  daily to 30mg  daily. Take 30mg  daily for 3 weeks, then take 20mg  daily to continue daily (nurse will send script)  #palpitations  - we need to ensure that there is no heart involvement with sarcoid; please see Dr Marca Ancona of [facility]  #followup  - august 2013 with spirometry at followup  - any problems call sooner

## 2012-01-02 ENCOUNTER — Encounter: Payer: Self-pay | Admitting: Cardiology

## 2012-01-02 ENCOUNTER — Ambulatory Visit (INDEPENDENT_AMBULATORY_CARE_PROVIDER_SITE_OTHER): Payer: 59 | Admitting: Cardiology

## 2012-01-02 VITALS — BP 133/84 | HR 67 | Ht 62.0 in | Wt 143.0 lb

## 2012-01-02 DIAGNOSIS — R002 Palpitations: Secondary | ICD-10-CM

## 2012-01-02 DIAGNOSIS — D869 Sarcoidosis, unspecified: Secondary | ICD-10-CM

## 2012-01-02 NOTE — Assessment & Plan Note (Signed)
Patient has runs of 30-40 seconds where she will feel her heart race.  Sometimes she is lightheaded, no syncope.  Less episodes since going on prednisone.  ECG is normal with no evidence for conduction system abnormalities.  Most worrisome entity on the differential diagnosis would be runs of NSVT/VT in the setting of cardiac sarcoidosis. It is certainly possible, also, that she has a co-existing SVT such as AVNRT (statistically the most likely tachyarrhythmia in her demographic).   - 3 week event monitor to try to identify tachyarrhythmia.  - Due to concern for possible cardiac sarcoidosis with NSVT/VT, I will get a cardiac MRI to assess for myocardial infiltration.  - Followup in 1 month.

## 2012-01-02 NOTE — Patient Instructions (Signed)
Your physician recommends that you have lab work today---BMET  Your physician has recommended that you wear an event monitor. Event monitors are medical devices that record the heart's electrical activity. Doctors most often [place] these monitors to diagnose arrhythmias. Arrhythmias are problems with the speed or rhythm of the heartbeat. The monitor is a small, portable device. You can wear one while you do your normal daily activities. This is usually used to diagnose what is causing palpitations/syncope (passing out). 3 week event monitor  Your physician has requested that you have a cardiac MRI. Cardiac MRI uses a computer to create images of your heart as its beating, producing both still and moving pictures of your heart and major blood vessels. For further information please visit [email or web address]. Please follow the instruction sheet given to you today for more information.   Your physician recommends that you schedule a follow-up appointment in: about 5 weeks with Dr Shirlee Latch.

## 2012-01-02 NOTE — Progress Notes (Signed)
Patient ID: CALIYAH SIEH, female   DOB: Jul 17, 1975, 36 y.o.   MRN: [id number] PCP: [facility]  36 yo with history of sarcoidosis presents for evaluation of palpitations.  Patient was recently diagnosed with sarcoidosis with pulmonary and skin involvement.  She reports 6-7 months of dyspnea and chest tightness, not necessarily associated with exertion.   She also has had frequent palpitations. She had a rash and joint pains as well.  She was initially sent to a rheumatologist who then sent her to a pulmonologist Marchelle Gearing).  Diagnosis was made by bronchoscopy with biopsy.  CT chest consistent with pulmonary sarcoidosis.  On prednisone, the dyspnea and chest pain has significantly improved and rash has cleared up.   For 6-7 months, she has had frequent palpitations.  She will feel her heart race for about 30-40 seconds, then it will stop.  She occasionally gets lightheaded when her heart races but she has never passed out.  No trigger.  She does not drink caffeine or use over-the-counter cold meds.  No illicit drugs. Since going on prednisone, she has been having less palpitations.  Only 3 episodes in the last 2-3 weeks.    Labs (5/13): K 4.2, creatinine 0.79  ECG: NSR, normal  PMH: 1. Sarcoidosis: Pulmonary and skin.  Diagnosis made in spring of 2013.  Lupus pernio, ankle arthropathy, peripheral neuropathy.  CT chest in 5/13 with mediastinal and bilateral hilar lymphadenopathy as well as upper lobe pulmonary nodularity.   2. Palpitations  SH: Lives in [place].  Nonsmoker, no drugs, no ETOH.  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   FH: Brother with "hole in heart," had cardiac surgery when a child.   ROS: All systems reviewed and negative except as per HPI.   Current Outpatient Prescriptions  Medication Sig Dispense Refill  . Ferrous Fumarate (HEMOCYTE PO) Take by mouth. As directed      . predniSONE (DELTASONE) 10 MG tablet Take 30 mg daily for 3 weeks then  decrease to 20mg  and continue daily.  100 tablet  2  . TRI-SPRINTEC 0.18/0.215/0.25 MG-35 MCG tablet As directed        BP 133/84  Pulse 67  Ht 5\' 2"  (1.575 m)  Wt 64.864 kg (143 lb)  BMI 26.15 kg/m2 General: NAD Neck: No JVD, no thyromegaly or thyroid nodule.  Lungs: Occasional crackles upper lung fields. CV: Nondisplaced PMI.  Heart regular S1/S2, no S3/S4, no murmur.  No peripheral edema.  No carotid bruit.  Normal pedal pulses.  Abdomen: Soft, nontender, no hepatosplenomegaly, no distention.  Skin: Intact without lesions or rashes.  Neurologic: Alert and oriented x 3.  Psych: Normal affect. Extremities: No clubbing or cyanosis.  HEENT: Normal.

## 2012-01-03 ENCOUNTER — Encounter: Payer: Self-pay | Admitting: Cardiology

## 2012-01-03 LAB — BASIC METABOLIC PANEL
CO2: 28 mEq/L (ref 19–32)
Calcium: 8.9 mg/dL (ref 8.4–10.5)
Creatinine, Ser: 0.6 mg/dL (ref 0.4–1.2)
GFR: 150.28 mL/min (ref >60.00)
Sodium: 137 mEq/L (ref 135–145)

## 2012-01-07 ENCOUNTER — Encounter (INDEPENDENT_AMBULATORY_CARE_PROVIDER_SITE_OTHER): Payer: 59

## 2012-01-07 DIAGNOSIS — D869 Sarcoidosis, unspecified: Secondary | ICD-10-CM

## 2012-01-07 DIAGNOSIS — R002 Palpitations: Secondary | ICD-10-CM

## 2012-01-08 ENCOUNTER — Ambulatory Visit (HOSPITAL_COMMUNITY)
Admission: RE | Admit: 2012-01-08 | Discharge: 2012-01-08 | Disposition: A | Discharge status: Home / Self Care | Payer: 59 | Source: Ambulatory Visit | Attending: Cardiology | Admitting: Cardiology

## 2012-01-08 DIAGNOSIS — I498 Other specified cardiac arrhythmias: Secondary | ICD-10-CM | POA: Insufficient documentation

## 2012-01-08 DIAGNOSIS — I499 Cardiac arrhythmia, unspecified: Secondary | ICD-10-CM

## 2012-01-08 DIAGNOSIS — D869 Sarcoidosis, unspecified: Secondary | ICD-10-CM | POA: Insufficient documentation

## 2012-01-08 MED ORDER — GADOBENATE DIMEGLUMINE 529 MG/ML IV SOLN
20.0000 mL | Freq: Once | INTRAVENOUS | Status: AC
  Administered 2012-01-08: 20 mL via INTRAVENOUS

## 2012-01-09 ENCOUNTER — Telehealth: Payer: Self-pay | Admitting: Cardiology

## 2012-01-09 NOTE — Telephone Encounter (Signed)
Pt mri done yesterday, pls call with results

## 2012-01-09 NOTE — Telephone Encounter (Signed)
Adsvised pt that we will call her with results when they become available.

## 2012-01-10 ENCOUNTER — Telehealth: Payer: Self-pay | Admitting: Cardiology

## 2012-01-10 NOTE — Telephone Encounter (Signed)
LMTCB

## 2012-01-10 NOTE — Telephone Encounter (Signed)
Voice mail at both numbers

## 2012-01-10 NOTE — Telephone Encounter (Signed)
Fu call °Pt returning your call  °

## 2012-01-10 NOTE — Telephone Encounter (Signed)
Spoke with pt about recent cardiac MRI results

## 2012-01-31 ENCOUNTER — Telehealth: Payer: Self-pay | Admitting: *Deleted

## 2012-01-31 NOTE — Telephone Encounter (Signed)
Spoke with pt and reviewed the results of her monitor which demonstrated NSR, sinus tachycardia without any significant arrhythmias.  Pt stated understanding.  She reports that she is usually sitting when she has the sinus tachycardia.  She is scheduled for f/u of a testing.  Instructed pt to call back prior to then if symptoms occur.

## 2012-02-26 ENCOUNTER — Ambulatory Visit: Payer: 59 | Admitting: Cardiology

## 2012-07-06 ENCOUNTER — Telehealth: Payer: Self-pay | Admitting: Internal Medicine

## 2012-07-06 MED ORDER — PREDNISONE 10 MG PO TABS: 10.0000 mg | ORAL_TABLET | Freq: Every day | ORAL | Status: DC

## 2012-07-06 NOTE — Telephone Encounter (Signed)
She was supposed to see me in august 2013 when my plan was to taper her from 20mg  per day to 10mg  per day. Why she never came ? ANd how is20mg  per day working for her ? If asymptomatic and doing [place], just give her 10mg  per day for 30 days. I will see her at fu 07/29/12 to decide further

## 2012-07-06 NOTE — Telephone Encounter (Signed)
.   Spoke with pt requesting prednisone ,pt has appt feb 5th.  Prednisone 10mg  take 2 tablets daily. #60 X5 No Known Allergies  MR is this ok to fill  Thank you

## 2012-07-06 NOTE — Telephone Encounter (Signed)
Spoke with pt she didn't make appt when she left and no one called her Also pt said 20 mg per day was working good. No problems. Called in  rx for the 10 mg pt aware. Nothing further needed.

## 2012-07-29 ENCOUNTER — Ambulatory Visit: Payer: 59 | Admitting: Internal Medicine

## 2012-08-11 ENCOUNTER — Ambulatory Visit (INDEPENDENT_AMBULATORY_CARE_PROVIDER_SITE_OTHER): Payer: 59 | Admitting: Internal Medicine

## 2012-08-11 ENCOUNTER — Encounter: Payer: Self-pay | Admitting: Internal Medicine

## 2012-08-11 ENCOUNTER — Other Ambulatory Visit (INDEPENDENT_AMBULATORY_CARE_PROVIDER_SITE_OTHER): Payer: 59

## 2012-08-11 VITALS — BP 126/80 | HR 75 | Temp 98.0°F | Ht 62.0 in | Wt 165.8 lb

## 2012-08-11 DIAGNOSIS — E139 Other specified diabetes mellitus without complications: Secondary | ICD-10-CM

## 2012-08-11 DIAGNOSIS — E559 Vitamin D deficiency, unspecified: Secondary | ICD-10-CM

## 2012-08-11 DIAGNOSIS — T380X5A Adverse effect of glucocorticoids and synthetic analogues, initial encounter: Secondary | ICD-10-CM

## 2012-08-11 DIAGNOSIS — D869 Sarcoidosis, unspecified: Secondary | ICD-10-CM

## 2012-08-11 DIAGNOSIS — E099 Drug or chemical induced diabetes mellitus without complications: Secondary | ICD-10-CM

## 2012-08-11 LAB — HEMOGLOBIN A1C: Hgb A1c MFr Bld: 5 % (ref 4.6–6.5)

## 2012-08-11 NOTE — Patient Instructions (Addendum)
#  Sarcoidosis - Glad you're doing better in terms of neuropathy, palpitations, skin problems and respiratory issues - I have advised to decrease prednisone to 5 mg per day, but because you still do have some amount of tingling and some amount of palpitations that you feel is due to sarcoid we will continue as agreed the prednisone at 10 mg per day - I will see you in 6 months with spirometry at followup  #Long-term prednisone therapy  - Check vitamin D and hemoglobin A1c level today  #Followup  - 6 months with spirometry at followup

## 2012-08-11 NOTE — Progress Notes (Signed)
Subjective:    Patient ID: Mindy Brown, female    DOB: 04-Aug-1975, 37 y.o.   MRN: [id number]  HPI Sarcoidosis    - Pulmonary Stage 1 leading into early stage 2:  ACE level > 100. EBUS diagnosis 11/21/11, AA female - Lupus pernio  Resolved Feb 2014 with prednisone 10 mg per day - Ankle arthropath  - Resolved February 2014 with prednisone 10 mg per day y  - Class B symptoms   - Resolved February 2014 with prednisone 10 mg per day  - Commenced prednisone 6/43/13 - Palpitations  - Cardiac evaluation normal cardiac MRI July 2013  - Normal sinus rhythm on Holter monitor July 2030     OV 08/11/2012  Followup sarcoidosis   -- She missed a 70-month appointment and is now here for six-month followup. Last seen 6 months ago. Currently taking prednisone at 10 mg per day. With this her ankle arthropathy lupus pernio in respiratory symptoms have completely resolved. She occasionally has palpitations despite being free of caffeine. She did see cardiology and had a normal cardiac MRI and normal Holter monitoring. Palpitations unexplained. It is associated with some chest tightness but no focal tenderness. She feels prednisone 10 mg per day should be her dose and she does not want to taper down further.  In terms of prednisone side effects she's had weight gain. Apparently this happened when she had her take Neurontin for neuropathy that was prescribed to the neurologist. So she stopped the Neurontin couple of months ago. She does not want to see the neurologist again. The neuropathy itself is improved   Past, Family, Social reviewed: Mom now diagnosed with stage IIIB breast cancer and sarcoidosis not otherwise specified  Review of Systems  Constitutional: Negative for fever and unexpected weight change.  HENT: Negative for ear pain, nosebleeds, congestion, sore throat, rhinorrhea, sneezing, trouble swallowing, dental problem, postnasal drip and sinus pressure.   Eyes: Negative for redness  and itching.  Respiratory: Negative for cough, chest tightness, shortness of breath and wheezing.   Cardiovascular: Negative for palpitations and leg swelling.  Gastrointestinal: Negative for nausea and vomiting.  Genitourinary: Negative for dysuria.  Musculoskeletal: Negative for joint swelling.  Skin: Negative for rash.  Neurological: Negative for headaches.  Hematological: Does not bruise/bleed easily.  Psychiatric/Behavioral: Negative for dysphoric mood. The patient is not nervous/anxious.       Current outpatient prescriptions:ALPRAZolam (XANAX) 0.5 MG tablet, Take 0.5 mg by mouth at bedtime as needed for sleep., Disp: , Rfl: ;  predniSONE (DELTASONE) 10 MG tablet, Take 1 tablet (10 mg total) by mouth daily., Disp: 30 tablet, Rfl: 0;  TRI-SPRINTEC 0.18/0.215/0.25 MG-35 MCG tablet, As directed, Disp: , Rfl:   Objective:   Physical Exam Vitals reviewed. Constitutional: She is oriented to person, place, and time. She appears well-developed and well-nourished. No distress.  HENT:  Head: Normocephalic and atraumatic.  Right Ear: External ear normal.  Left Ear: External ear normal.  Mouth/Throat: Oropharynx is clear and moist. No oropharyngeal exudate.  Eyes: Conjunctivae and EOM are normal. Pupils are equal, round, and reactive to light. Right eye exhibits no discharge. Left eye exhibits no discharge. No scleral icterus.  Neck: Normal range of motion. Neck supple. No JVD present. No tracheal deviation present. No thyromegaly present.  Cardiovascular: Normal rate, regular rhythm, normal heart sounds and intact distal pulses.  Exam reveals no gallop and no friction rub.   No murmur heard. Pulmonary/Chest: Effort normal and breath sounds normal. No respiratory distress. She has  no wheezes. She has no rales. She exhibits no tenderness.  Abdominal: Soft. Bowel sounds are normal. She exhibits no distension and no mass. There is no tenderness. There is no rebound and no guarding.   Musculoskeletal: Normal range of motion. She exhibits no edema and no tenderness.  Lymphadenopathy:    She has no cervical adenopathy.  Neurological: She is alert and oriented to person, place, and time. She has normal reflexes. No cranial nerve deficit. She exhibits normal muscle tone. Coordination normal.  Skin: Skin is warm and dry.  noted. She is not diaphoretic. No erythema. No pallor.       lupuse pernio all over face  has resolved as of February 2014  Psychiatric: She has a normal mood and affect. Her behavior is normal. Judgment and thought content normal.            Assessment & Plan:

## 2012-08-12 LAB — VITAMIN D 25 HYDROXY (VIT D DEFICIENCY, FRACTURES): Vit D, 25-Hydroxy: 13 ng/mL — ABNORMAL LOW (ref 30–89)

## 2012-08-12 NOTE — Assessment & Plan Note (Signed)
#  Sarcoidosis - Glad you're doing better in terms of neuropathy, palpitations, skin problems and respiratory issues - I have advised to decrease prednisone to 5 mg per day, but because you still do have some amount of tingling and some amount of palpitations that you feel is due to sarcoid we will continue as agreed the prednisone at 10 mg per day - I will see you in 6 months with spirometry at followup  #Long-term prednisone therapy  - Check vitamin D and hemoglobin A1c level today  #Followup  - 6 months with spirometry at followup   (> 50% of this 15 min visit spent in face to face counseling)

## 2012-08-14 ENCOUNTER — Telehealth: Payer: Self-pay | Admitting: Internal Medicine

## 2012-08-14 MED ORDER — PREDNISONE 10 MG PO TABS: 10.0000 mg | ORAL_TABLET | Freq: Every day | ORAL | Status: DC

## 2012-08-14 MED ORDER — ERGOCALCIFEROL 1.25 MG (50000 UT) PO CAPS: 50000.0000 [IU] | ORAL_CAPSULE | ORAL | Status: DC

## 2012-08-14 NOTE — Telephone Encounter (Signed)
Notes Recorded by Kalman Shan, MD on 08/12/2012 at 12:19 PM Vitamin D level is extremely low she needs to be on megadose replacement  - vitamin D3 50,000 (50k) units once a week x 12 weeks, then once a month on first of each month - this cannot be vitamin d2. But has to to be Vit D3 - If pharmacy only has vitamin d2 let me know, then [facility] - we will recheck level at fu        Spoke with pt and notified of recs per MR She verbalized understanding and denied any questions Rx was sent to pharm I have refilled the pred per pt request

## 2012-08-14 NOTE — Progress Notes (Signed)
Quick Note:  Spoke with pt and notified of results per Dr.Ramaswamy. Pt verbalized understanding and denied any questions.  ______ 

## 2013-01-20 ENCOUNTER — Telehealth: Payer: Self-pay | Admitting: Internal Medicine

## 2013-01-20 NOTE — Telephone Encounter (Signed)
Called patient x3 and lm for return appointment. No return call back. Sent letter 01/20/13. °

## 2015-05-25 ENCOUNTER — Encounter: Payer: Self-pay | Admitting: Internal Medicine

## 2015-05-25 ENCOUNTER — Other Ambulatory Visit (INDEPENDENT_AMBULATORY_CARE_PROVIDER_SITE_OTHER): Payer: Managed Care, Other (non HMO)

## 2015-05-25 ENCOUNTER — Ambulatory Visit (INDEPENDENT_AMBULATORY_CARE_PROVIDER_SITE_OTHER)
Admission: RE | Admit: 2015-05-25 | Discharge: 2015-05-25 | Disposition: A | Discharge status: Home / Self Care | Payer: Managed Care, Other (non HMO) | Source: Ambulatory Visit | Attending: Internal Medicine | Admitting: Internal Medicine

## 2015-05-25 ENCOUNTER — Telehealth: Payer: Self-pay | Admitting: Internal Medicine

## 2015-05-25 ENCOUNTER — Ambulatory Visit (INDEPENDENT_AMBULATORY_CARE_PROVIDER_SITE_OTHER): Payer: Managed Care, Other (non HMO) | Admitting: Internal Medicine

## 2015-05-25 DIAGNOSIS — D869 Sarcoidosis, unspecified: Secondary | ICD-10-CM

## 2015-05-25 LAB — CBC WITH DIFFERENTIAL/PLATELET
BASOS PCT: 0.4 % (ref 0.0–3.0)
Basophils Absolute: 0 10*3/uL (ref 0.0–0.1)
EOS PCT: 0.5 % (ref 0.0–5.0)
Eosinophils Absolute: 0 10*3/uL (ref 0.0–0.7)
HEMATOCRIT: 35.5 % — AB (ref 36.0–46.0)
HEMOGLOBIN: 11.6 g/dL — AB (ref 12.0–15.0)
LYMPHS PCT: 11.5 % — AB (ref 12.0–46.0)
Lymphs Abs: 0.8 10*3/uL (ref 0.7–4.0)
MCHC: 32.8 g/dL (ref 30.0–36.0)
MCV: 86.2 fl (ref 78.0–100.0)
Monocytes Absolute: 0.4 10*3/uL (ref 0.1–1.0)
Monocytes Relative: 6.3 % (ref 3.0–12.0)
NEUTROS ABS: 5.5 10*3/uL (ref 1.4–7.7)
Neutrophils Relative %: 81.3 % — ABNORMAL HIGH (ref 43.0–77.0)
Platelets: 296 10*3/uL (ref 150.0–400.0)
RBC: 4.12 Mil/uL (ref 3.87–5.11)
RDW: 15.3 % (ref 11.5–15.5)
WBC: 6.8 10*3/uL (ref 4.0–10.5)

## 2015-05-25 LAB — HEMOGLOBIN A1C: HEMOGLOBIN A1C: 5.3 % (ref 4.6–6.5)

## 2015-05-25 LAB — BASIC METABOLIC PANEL
BUN: 14 mg/dL (ref 6–23)
CHLORIDE: 105 meq/L (ref 96–112)
CO2: 26 meq/L (ref 19–32)
Calcium: 9.3 mg/dL (ref 8.4–10.5)
Creatinine, Ser: 0.65 mg/dL (ref 0.40–1.20)
GFR: 131.8 mL/min (ref >60.00)
Glucose, Bld: 93 mg/dL (ref 70–99)
POTASSIUM: 4 meq/L (ref 3.5–5.1)
Sodium: 138 mEq/L (ref 135–145)

## 2015-05-25 LAB — SEDIMENTATION RATE: SED RATE: 13 mm/h (ref 0–22)

## 2015-05-25 MED ORDER — PREDNISONE 10 MG PO TABS: ORAL_TABLET | ORAL | Status: DC

## 2015-05-25 MED ORDER — HYDROXYCHLOROQUINE SULFATE 200 MG PO TABS: 200.0000 mg | ORAL_TABLET | Freq: Every day | ORAL | Status: DC

## 2015-05-25 NOTE — Telephone Encounter (Signed)
lmtcb for pt.   MR please advise if ok to put with TP in 6 weeks as MR has no available openings until 2/20.

## 2015-05-25 NOTE — Telephone Encounter (Signed)
Yeah give one in interim with TP and then some 8 weeks after that with me; can do both now

## 2015-05-25 NOTE — Telephone Encounter (Signed)
Patient returned call, [phone], may leave a msg.

## 2015-05-25 NOTE — Telephone Encounter (Signed)
Pt is aware we are working on appt. Please advise MR thanks

## 2015-05-25 NOTE — Patient Instructions (Signed)
Please remember to go to the lab and x-ray department downstairs for your tests - we will call you with the results when they are available.    Start plaquenil 200 mg daily and reduce the prednisone to 20 mg alternating with 10 mg a day  If joint aches are the problem go ahead and use the ibuprofen with meals  Please schedule a follow up office visit in 6 weeks, call sooner if needed to [facility]

## 2015-05-25 NOTE — Progress Notes (Signed)
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 Last seen 6 months ago. Currently taking prednisone at 10 mg per day. With this her ankle arthropathy lupus pernio in respiratory symptoms have completely resolved. She occasionally has palpitations despite being free of caffeine. She did see cardiology and had a normal cardiac MRI and normal Holter monitoring. Palpitations unexplained. It is associated with some chest tightness but no focal tenderness. She feels prednisone 10 mg per day should be her dose and she does not want to taper down further.  In terms of prednisone side effects she's had weight gain. Apparently this happened when she had her take Neurontin for neuropathy that was prescribed to the neurologist. So she stopped the Neurontin couple of months ago. She does not want to see the neurologist again. The neuropathy itself is improved rec #Sarcoidosis - Glad you're doing better in terms of neuropathy, palpitations, skin problems and respiratory issues - I have advised to decrease prednisone to 5 mg per day, but because you still do have some amount of tingling and some amount of palpitations that you feel is due to sarcoid we will continue as agreed the prednisone at 10 mg per day - I will see you in 6 months with spirometry at  followup  #Long-term prednisone therapy  - Check vitamin D and hemoglobin A1c level today  #Followup  - 6 months with spirometry at followup      05/25/2015  f/u ov/Mindy Brown re: steroid dep sarcoid  Chief Complaint  Patient presents with  . Follow-up    MR pt. Pt c/o wet cough with brown/green mucus, some wheeze, dyspnea with exertion and mid-sternal chest pain that comes and goes.   symptoms are all chronic, not acute, attributed to sarcoid but on 20 mg prednisone s taper, each time tapers has skin flare assoc with aches in multiple joints esp ankles   No obvious day to day or daytime variability or assoc  chest tightness,   overt sinus or hb symptoms. No unusual exp hx or h/o childhood pna/ asthma or knowledge of premature birth.  Sleeping ok without nocturnal  or early am exacerbation  of respiratory  c/o's or need for noct saba. Also denies any obvious fluctuation of symptoms with weather or environmental changes or other aggravating or alleviating factors except as outlined above   Current Medications, Allergies, Complete Past Medical History, Past Surgical History, Family History, and Social History were reviewed in [facility] electronic medical record.  ROS  The following are not active complaints unless bolded sore throat, dysphagia, dental problems, itching, sneezing,  nasal congestion or excess/ purulent secretions, ear ache,   fever, chills, sweats, unintended wt loss, classically pleuritic or exertional cp, hemoptysis,  orthopnea pnd or leg  swelling, presyncope, palpitations, abdominal pain, anorexia, nausea, vomiting, diarrhea  or change in bowel or bladder habits, change in stools or urine, dysuria,hematuria,  rash, arthralgias, visual complaints, headache, numbness, weakness or ataxia or problems with walking or coordination,  change in mood/affect or memory.              Objective:   Physical Exam    amb bf nad   Wt Readings from Last 3 Encounters:  05/25/15  79.289 kg (174 lb 12.8 oz)  08/11/12 75.206 kg (165 lb 12.8 oz)  01/02/12 64.864 kg (143 lb)    Vital signs reviewed  Constitutional: She is oriented to person, place, and time. She appears well-developed and well-nourished. No distress.  HENT:  Head: Normocephalic and atraumatic.  Right Ear: External ear normal.  Left Ear: External ear normal.  Mouth/Throat: Oropharynx is clear and moist. No oropharyngeal exudate.  Eyes: Conjunctivae and EOM are normal. Pupils are equal, round, and reactive to light. Right eye exhibits no discharge. Left eye exhibits no discharge. No scleral icterus.  Neck: Normal range of motion. Neck supple. No JVD present. No tracheal deviation present. No thyromegaly present.  Cardiovascular: Normal rate, regular rhythm, normal heart sounds and intact distal pulses.  Exam reveals no gallop and no friction rub.   No murmur heard. Pulmonary/Chest: Effort normal and breath sounds normal. No respiratory distress. She has no wheezes. She has no rales. She exhibits no tenderness.  Abdominal: Soft. Bowel sounds are normal. She exhibits no distension and no mass. There is no tenderness. There is no rebound and no guarding.  Musculoskeletal: Normal range of motion. She exhibits no edema and no tenderness.  Lymphadenopathy:    She has no cervical adenopathy.  Neurological: She is alert and oriented to person, place, and time. She has normal reflexes. No cranial nerve deficit. She exhibits normal muscle tone. Coordination normal.  Skin: Skin is warm and dry.  noted. She is not diaphoretic. No erythema. No pallor.    Psychiatric: She has a normal mood and affect. Her behavior is normal. Judgment and thought content normal.       CXR PA and Lateral:   05/25/2015 :    I personally reviewed images and agree with radiology impression as follows:   Normal chest radiographs.    Chemistry      Component Value Date/Time   NA 138 05/25/2015 1534   K 4.0 05/25/2015 1534   CL 105  05/25/2015 1534   CO2 26 05/25/2015 1534   BUN 14 05/25/2015 1534   CREATININE 0.65 05/25/2015 1534      Component Value Date/Time   CALCIUM 9.3 05/25/2015 1534   ALKPHOS 43 11/20/2011 1626   AST 31 11/20/2011 1626   ALT 20 11/20/2011 1626   BILITOT 0.4 11/20/2011 1626       Lab Results  Component Value Date   WBC 6.8 05/25/2015   HGB 11.6* 05/25/2015   HCT 35.5* 05/25/2015   MCV 86.2 05/25/2015   PLT 296.0 05/25/2015       Lab Results  Component Value Date   ESRSEDRATE 13 05/25/2015       Assessment & Plan:

## 2015-05-26 NOTE — Telephone Encounter (Signed)
Called and spoke with patient. Informed her of MR's recs. Scheduled patient for 06/26/14 at 4:15 and with MR 8 weeks later on 08/21/14 at 4: 15. Patient voiced understanding and had no further questions. Nothing further needed. Will sign off on message.

## 2015-06-01 ENCOUNTER — Encounter: Payer: Self-pay | Admitting: Internal Medicine

## 2015-06-01 NOTE — Assessment & Plan Note (Signed)
Not clear she needs any prednisone for her pulmonary status but does gets joint aches and rash with taper so should be a good candidate for Plaquenil to serve as a "steroid sparer"   The goal with a chronic steroid dependent illness is always arriving at the lowest effective dose that controls the disease/symptoms and not accepting a set "formula" which is based on statistics or guidelines that don't always take into account patient  variability or the natural hx of the dz in every individual patient, which may well vary over time.  For now therefore I recommend the patient maintain  20 a/w 10 p start on plaquenil 200 mg daily and f/u with Ramaswamy to taper further  I had an extended discussion with the patient reviewing all relevant studies completed to date and  lasting 15 to 20 minutes of a 25 minute visit    Each maintenance medication was reviewed in detail including most importantly the difference between maintenance and prns and under what circumstances the prns are to be triggered using an action plan format that is not reflected in the computer generated alphabetically organized AVS.    Please see instructions for details which were reviewed in writing and the patient given a copy highlighting the part that I personally wrote and discussed at today's ov.

## 2015-06-21 ENCOUNTER — Other Ambulatory Visit: Payer: Self-pay | Admitting: Internal Medicine

## 2015-06-27 ENCOUNTER — Ambulatory Visit: Payer: Managed Care, Other (non HMO) | Admitting: Adult Health

## 2015-07-26 ENCOUNTER — Other Ambulatory Visit: Payer: Self-pay | Admitting: Internal Medicine

## 2015-08-22 ENCOUNTER — Ambulatory Visit: Payer: Managed Care, Other (non HMO) | Admitting: Internal Medicine

## 2015-08-24 ENCOUNTER — Ambulatory Visit (INDEPENDENT_AMBULATORY_CARE_PROVIDER_SITE_OTHER): Payer: Managed Care, Other (non HMO) | Admitting: Internal Medicine

## 2015-08-24 ENCOUNTER — Other Ambulatory Visit: Payer: Managed Care, Other (non HMO)

## 2015-08-24 ENCOUNTER — Encounter: Payer: Self-pay | Admitting: Internal Medicine

## 2015-08-24 VITALS — BP 126/80 | HR 83 | Ht 62.0 in | Wt 173.4 lb

## 2015-08-24 DIAGNOSIS — T148XXA Other injury of unspecified body region, initial encounter: Secondary | ICD-10-CM

## 2015-08-24 DIAGNOSIS — M2550 Pain in unspecified joint: Secondary | ICD-10-CM

## 2015-08-24 DIAGNOSIS — D869 Sarcoidosis, unspecified: Secondary | ICD-10-CM

## 2015-08-24 LAB — RHEUMATOID FACTOR: Rhuematoid fact SerPl-aCnc: <10 IU/mL (ref <=14)

## 2015-08-24 NOTE — Patient Instructions (Addendum)
ICD-9-CM ICD-10-CM   1. Sarcoidosis (HCC) 135 D86.9   2. Arthralgia 719.40 M25.50   3. Bruising 924.9 T14.8    For arthralgia: check Serum: ANA, DS-DNA, RF, anti-CCP, ssA, ssB, scl-70. Continue Plaquenil  sarcoidosis: Chest x-ray December 2016 shows significant improvement. Please cut her prednisone down to 10 mg once a day   Easy bruising: This is seen on right shoulder. I do not know if this is related to insect bite 3 weeks ago. Glad you're following up with an allergist. It'll be interesting to see what happens to the bruising as you come down on prednisone  Follow-up - We will call you with the blood test results - Return to see me in 3 months or sooner if needed

## 2015-08-24 NOTE — Progress Notes (Signed)
Subjective:     Patient ID: Mindy Brown, female   DOB: Jun 25, 1975, 40 y.o.   MRN: [id number] PCP Galvin Proffer, MD   HPI  IOV  08/24/2015  Sarcoidosis    - Pulmonary Stage 1 leading into early stage 2:  ACE level > 100. 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 Last seen 6 months ago. Currently taking prednisone at 10 mg per day. With this her ankle arthropathy lupus pernio in respiratory symptoms have completely resolved. She occasionally has palpitations despite being free of caffeine. She did see cardiology and had a normal cardiac MRI and normal Holter monitoring. Palpitations unexplained. It is associated with some chest tightness but no focal tenderness. She feels prednisone 10 mg per day should be her dose and she does not want to taper down further.  In terms of prednisone side effects she's had weight gain. Apparently this happened when she had her take Neurontin for neuropathy that was prescribed to the neurologist. So she stopped the Neurontin couple of months ago. She does not want to see the neurologist again. The neuropathy itself is improved rec #Sarcoidosis - Glad you're doing better in terms of neuropathy, palpitations, skin problems and respiratory issues - I have advised to decrease prednisone to 5 mg per day, but because you still do have some amount of tingling and some amount of palpitations that you feel is due to sarcoid we will continue as agreed the prednisone at 10 mg per  day - I will see you in 6 months with spirometry at followup  #Long-term prednisone therapy  - Check vitamin D and hemoglobin A1c level today  #Followup  - 6 months with spirometry at followup    05/25/2015  f/u ov/[name] re: steroid dep sarcoid  Chief Complaint  Patient presents with  . Follow-up    MR pt. Pt c/o wet cough with brown/green mucus, some wheeze, dyspnea with exertion and mid-sternal chest pain that comes and goes.   symptoms are all chronic, not acute, attributed to sarcoid but on 20 mg prednisone s taper, each time tapers has skin flare assoc with aches in multiple joints esp ankles   No obvious day to day or daytime variability or assoc  chest tightness,   overt sinus or hb symptoms. No unusual exp hx or h/o childhood pna/ asthma or knowledge of premature birth.  Sleeping ok without nocturnal  or early am exacerbation  of respiratory  c/o's or need for noct saba. Also denies any obvious fluctuation of symptoms with weather or environmental changes or other aggravating or alleviating factors except as outlined above     OV 08/24/2015  Chief Complaint  Patient presents with  . Follow-up    Pt last seen by MR on 08/11/2012 then saw MW on 05/25/2015. Pt states she was bit by an insect on left arm 3 weeks ago, pt then developed hives that turned into bruising - pt unsure if the hives were related  to her sarcoid. Pt states she has occasional SOB and prod cough with green/yellow mucus x 1 week. Pt denies CP/tightness and f/c/s.      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 Since then she's been on prednisone. She missed follow-up. She last saw my partner Dr. Sherene Brown in December 2016 and at that time was advised to taper her prednisone between 20 mg and 10 mg daily [prior to that she was on 20 mg daily]. He did a chest x-ray which are visualized today and it is normal and significantly improved compared to 2013.  There is hyperinflation though. She says that with the prednisone her symptoms of dyspnea have mostly resolved. However she continues to get mild arthralgia on and off. This is improved but the prednisone helps. Dr. Sherene Brown started her on Plaquenil in December 2016 which she says helps even more and his help her calm down on the prednisone. She is having financial issues with Plaquenil but manages. No fever or weight loss. Review of records shows that for her arthralgia she has not had autoimmune workup   new issue is that 3 weeks ago she got bit by presumably a spider in her left forearm or wrist. She had hives spreading up to the left arm shortly after that. Since then she has noticed easy bruising particularly in her contralateral right upper arm lateral aspect. She is due to see an allergist soon.   has a past medical history of Polyarthralgia; Asthma; Heart murmur; Shortness of breath; Pneumonia; Seasonal allergies; Sarcoidosis (HCC); Headache(784.0); Joint pain; Joint swelling; Rash; GERD (gastroesophageal reflux disease); Nocturia; Anemia; and Depression.   reports that she has never smoked. She has never used smokeless tobacco.  Past Surgical History  Procedure Laterality Date  . Cesarean section  1995  . Esophagogastroduodenoscopy      No Known Allergies  Immunization History  Administered Date(s) Administered  . Influenza Split 03/25/2011, 04/24/2012  . Pneumococcal Polysaccharide-23 06/24/2009    Family History  Problem Relation Age of Onset  . Heart disease Maternal Grandfather   . Anesthesia problems Neg Hx   . Hypotension Neg Hx   . Malignant hyperthermia Neg Hx   . Pseudochol deficiency Neg Hx      Current outpatient prescriptions:  .  alendronate (FOSAMAX) 70 MG tablet, Take 1 tablet by mouth once a week., Disp: , Rfl: 11 .  Calcium Carb-Cholecalciferol 600-800 MG-UNIT TABS, Take by mouth., Disp: , Rfl:  .  Cholecalciferol (VITAMIN D) 2000 UNITS CAPS, Take by mouth.,  Disp: , Rfl:  .  cyanocobalamin 500 MCG tablet, Take 500 mcg by mouth daily., Disp: , Rfl:  .  escitalopram (LEXAPRO) 10 MG tablet, Take 1 tablet by mouth daily., Disp: , Rfl:  .  hydroxychloroquine (PLAQUENIL) 200 MG tablet, Take 1 tablet (200 mg total) by mouth daily., Disp: 30 tablet, Rfl: 2 .  predniSONE (DELTASONE) 10 MG tablet, TAKE 1 TABLET BY MOUTH DAILY, Disp: 30 tablet, Rfl: 2 .  predniSONE (DELTASONE) 20 MG tablet, Take 1 tablet by mouth daily., Disp: , Rfl:  .  TRI-SPRINTEC 0.18/0.215/0.25 MG-35 MCG tablet, As directed, Disp: , Rfl:      Review of Systems     Objective:   Physical Exam  Constitutional: She is oriented to person, place, and time. She appears well-developed and well-nourished. No distress.  HENT:  Head: Normocephalic and atraumatic.  Right Ear: External ear normal.  Left  Ear: External ear normal.  Mouth/Throat: Oropharynx is clear and moist. No oropharyngeal exudate.  Eyes: Conjunctivae and EOM are normal. Pupils are equal, round, and reactive to light. Right eye exhibits no discharge. Left eye exhibits no discharge. No scleral icterus.  Neck: Normal range of motion. Neck supple. No JVD present. No tracheal deviation present. No thyromegaly present.  Cardiovascular: Normal rate, regular rhythm, normal heart sounds and intact distal pulses.  Exam reveals no gallop and no friction rub.   No murmur heard. Pulmonary/Chest: Effort normal and breath sounds normal. No respiratory distress. She has no wheezes. She has no rales. She exhibits no tenderness.  Abdominal: Soft. Bowel sounds are normal. She exhibits no distension and no mass. There is no tenderness. There is no rebound and no guarding.  Musculoskeletal: Normal range of motion. She exhibits no edema or tenderness.  Lymphadenopathy:    She has no cervical adenopathy.  Neurological: She is alert and oriented to person, place, and time. She has normal reflexes. No cranial nerve deficit. She exhibits normal  muscle tone. Coordination normal.  Skin: Skin is warm and dry. No rash noted. She is not diaphoretic. No erythema. No pallor.  2 times 3-5 centimeter bruising patches in her right upper arm lateral aspect  Psychiatric: She has a normal mood and affect. Her behavior is normal. Judgment and thought content normal.  Vitals reviewed.   Filed Vitals:   08/24/15 1345  BP: 126/80  Pulse: 83  Height:  (1.575 m)  Weight: 173 lb 6.4 oz (78.654 kg)  SpO2: 99%        Assessment:       ICD-9-CM ICD-10-CM   1. Sarcoidosis (HCC) 135 D86.9   2. Arthralgia 719.40 M25.50   3. Bruising 924.9 T14.8        Plan:      For arthralgia: check Serum: ANA, DS-DNA, RF, anti-CCP, ssA, ssB, scl-70. Continue Plaquenil  sarcoidosis: Chest x-ray December 2016 shows significant improvement. Please cut her prednisone down to 10 mg once a day   Easy bruising: This is seen on right shoulder. I do not know if this is related to insect bite 3 weeks ago. Glad you're following up with an allergist. It'll be interesting to see what happens to the bruising as you come down on prednisone  Follow-up - We will call you with the blood test results - Return to see me in 3 months or sooner if needed   Dr. Kalman Shan, M.D., [facility]C.P Pulmonary and Critical Care Medicine Staff Physician [facility] Pager: [phone], If no answer or between  15:00h - 7:00h: call [phone]  08/24/2015 2:18 PM

## 2015-08-25 LAB — ANA: ANA: NEGATIVE

## 2015-08-28 LAB — SJOGRENS SYNDROME-A EXTRACTABLE NUCLEAR ANTIBODY: SSA (RO) (ENA) ANTIBODY, IGG: NEGATIVE

## 2015-08-28 LAB — SJOGRENS SYNDROME-B EXTRACTABLE NUCLEAR ANTIBODY: SSB (La) (ENA) Antibody, IgG: <1

## 2015-08-28 LAB — ANTI-SCLERODERMA ANTIBODY: Scleroderma (Scl-70) (ENA) Antibody, IgG: <1

## 2015-08-28 LAB — CYCLIC CITRUL PEPTIDE ANTIBODY, IGG: Cyclic Citrullin Peptide Ab: <16 Units

## 2015-08-28 LAB — ANTI-DNA ANTIBODY, DOUBLE-STRANDED

## 2015-08-30 ENCOUNTER — Telehealth: Payer: Self-pay | Admitting: Internal Medicine

## 2015-08-30 NOTE — Telephone Encounter (Signed)
Per 08/24/15 OV: Follow-up - We will call you with the blood test results - Return to see me in 3 months or sooner if needed ---  Pt is calling requesting lab results from 08/24/15. Please advise MR thanks

## 2015-08-31 NOTE — Telephone Encounter (Signed)
I spoke with patient about results and she verbalized understanding and had no questions 

## 2015-08-31 NOTE — Telephone Encounter (Signed)
lmomtcb x1 

## 2015-08-31 NOTE — Telephone Encounter (Signed)
Autoimmune antibodies are normal

## 2015-08-31 NOTE — Telephone Encounter (Signed)
Patient returned call and may be reached at [phone].

## 2015-08-31 NOTE — Telephone Encounter (Signed)
Called and spoke with the pt. Informed her that we have not received MR's recs but once we receive the results and recs we will return her call. She voiced understanding and had no further questions.  MR please advise

## 2015-09-21 ENCOUNTER — Encounter: Payer: Self-pay | Admitting: Allergy and Immunology

## 2015-09-21 ENCOUNTER — Ambulatory Visit (INDEPENDENT_AMBULATORY_CARE_PROVIDER_SITE_OTHER): Payer: Managed Care, Other (non HMO) | Admitting: Allergy and Immunology

## 2015-09-21 VITALS — BP 118/74 | HR 76 | Temp 98.6°F | Resp 16 | Ht 60.55 in | Wt 171.3 lb

## 2015-09-21 DIAGNOSIS — J309 Allergic rhinitis, unspecified: Secondary | ICD-10-CM | POA: Diagnosis not present

## 2015-09-21 DIAGNOSIS — H1010 Acute atopic conjunctivitis, unspecified eye: Secondary | ICD-10-CM

## 2015-09-21 DIAGNOSIS — L308 Other specified dermatitis: Secondary | ICD-10-CM | POA: Diagnosis not present

## 2015-09-21 DIAGNOSIS — L989 Disorder of the skin and subcutaneous tissue, unspecified: Secondary | ICD-10-CM

## 2015-09-21 DIAGNOSIS — L509 Urticaria, unspecified: Secondary | ICD-10-CM

## 2015-09-21 MED ORDER — MONTELUKAST SODIUM 10 MG PO TABS: ORAL_TABLET | ORAL | Status: DC

## 2015-09-21 MED ORDER — RANITIDINE HCL 150 MG PO CAPS: ORAL_CAPSULE | ORAL | Status: DC

## 2015-09-21 NOTE — Patient Instructions (Addendum)
  1. Every day utilize the following medications:   A. cetirizine 10 mg one tablet twice a day  B. ranitidine 150 one tablet twice a day  C. montelukast 10 mg one tablet once a day  2. Can add over-the-counter Benadryl if needed  3. Blood tests - ANCA w / relex, SED, CRP, TSH, T4, thyroid peroxidase, urease breath test, CBC with differential, CMP, urine analysis  4. Further evaluation? Biopsy?  5. Return to clinic in 3 weeks or earlier if problem  6. Allergen avoidance measures

## 2015-09-21 NOTE — Progress Notes (Signed)
Dear Dr. Ardelle ParkHaque,  Thank you for referring Mindy Brown to [facility] [place] on 09/21/2015.   Below is a summation of this patient's evaluation and recommendations.  Thank you for your referral. I will keep you informed about this patient's response to treatment.   If you have any questions please to do hestitate to contact me.   Sincerely,  Jessica PriestEric J. Cortlin Marano, MD [facility]   ______________________________________________________________________    NEW PATIENT NOTE  Referring Provider: Shelbie AmmonsHaque, Imran P, MD Primary Provider: Galvin ProfferHAGUE, IMRAN P, MD Date of office visit: 09/21/2015    Subjective:   Chief Complaint:  Mindy Brown (DOB: 1975/08/13) is a [age] y.o. female with a chief complaint of Urticaria and Insect Bite  who presents to the clinic on 09/21/2015 with the following problems:  HPI Comments: Mindy Brown presents to this clinic in evaluation of rash. 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   About 2 months ago she believes that she was stung on her left arm. She never did see an insect. This happened very early in the morning soon after waking up. This area became quite swollen and her wrist swelled up and then she developed diffuse redness across her arm and then she started to develop red areas across her body. They were slightly itchy. Each one of her lesions will end of bruising over several days. This still appears to be an active issue ever since that event 2 months ago.  She has no associated systemic or constitutional symptoms although she has been intermittently nausea a few times per week without any vomiting or other GI symptoms since this event.  There is no obvious provoking factor that may be giving rise to his immunological hyperreactivity. She has not had a change in  her environment nor she on any new medications other than the fact that she started Fosamax and calcium and vitamin D supplementation for osteoporosis about one month prior to the onset of this reaction. She states that she has had a problem with fire ants in the past. She was stung on the legs by multiple fire ants about 5 years ago and developed large local reactions but no other associated systemic or constitutional symptoms.  She has a history of sarcoidosis and has been on prednisone for approximately 4 years. She was recently tapered from 20 mg to 10 mg last month. Apparently she had a follow-up pulmonary CT scan which did not identify any evidence of disease at that point. Her sarcoidosis originally presented as pulmonary and skin and joint involvement.     Past Medical History  Diagnosis Date  . Polyarthralgia   . Asthma     doesn't require any meds;dx in 2003  . Heart murmur     just found out about this in 04/2011  . Shortness of breath     sitting/lying/standing  . Pneumonia     hx of;in 1999  . Seasonal allergies   . Sarcoidosis (HCC)   . Headache(784.0)   . Joint pain   . Joint swelling   . Rash     on face  . GERD (gastroesophageal reflux disease)     doesn't require any meds at present  . Nocturia   . Anemia     takes Hemocyte plus  . Depression  was on celexa for a short period of time;has been off x 79yrs  . Osteoporosis   . Urticaria     Past Surgical History  Procedure Laterality Date  . Cesarean section  1995  . Esophagogastroduodenoscopy    . Lung biopsy        Medication List           alendronate 70 MG tablet  Commonly known as:  FOSAMAX  Take 1 tablet by mouth once a week.     Calcium Carb-Cholecalciferol 600-800 MG-UNIT Tabs  Take by mouth.     cyanocobalamin 500 MCG tablet  Take 500 mcg by mouth daily.     medroxyPROGESTERone 150 MG/ML injection  Commonly known as:  DEPO-PROVERA  GIVE 1 ML EVERY 3 MONTHS INTRAMUSCULARLY      predniSONE 10 MG tablet  Commonly known as:  DELTASONE  TAKE 1 TABLET BY MOUTH DAILY     Vitamin D 2000 units Caps  Take by mouth.        No Known Allergies  Review of systems negative except as noted in HPI / PMHx or noted below:  ROS  Family History  Problem Relation Age of Onset  . Anesthesia problems Neg Hx   . Hypotension Neg Hx   . Malignant hyperthermia Neg Hx   . Pseudochol deficiency Neg Hx   . Breast cancer Mother   . High blood pressure Father   . High blood pressure Paternal Aunt   . High blood pressure Paternal Uncle   . Heart disease Paternal Grandmother   . Leukemia Paternal Grandmother   . High blood pressure Paternal Aunt     Social History   Social History  . Marital Status: Divorced    Spouse Name: N/A  . Number of Children: N/A  . Years of Education: N/A   Occupational History  . scheduler    Social History Main Topics  . Smoking status: Never Smoker   . Smokeless tobacco: Never Used  . Alcohol Use: Yes     Comment: occasionally  . Drug Use: No  . Sexual Activity: Yes    Birth Control/ Protection: Pill   Other Topics Concern  . 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  HENT:  Head: Normocephalic. Head is without right periorbital erythema and without left periorbital erythema.  Right Ear: Tympanic membrane, external ear and ear canal normal.  Left Ear: Tympanic membrane,  external ear and ear canal normal.  Nose: Nose normal. No mucosal edema or rhinorrhea.  Mouth/Throat: Oropharynx is clear and moist and mucous membranes are normal. No oropharyngeal exudate.  Eyes: Conjunctivae and lids are normal. Pupils are equal, round, and reactive to light.  Neck: Trachea normal. No tracheal deviation present. No thyromegaly present.  Cardiovascular: Normal rate, regular rhythm, S1 normal, S2 normal and normal heart sounds.   No murmur heard. Pulmonary/Chest: Effort normal.  No stridor. No tachypnea. No respiratory distress. She has no wheezes. She has no rales. She exhibits no tenderness.  Abdominal: Soft. She exhibits no distension and no mass. There is no hepatosplenomegaly. There is no tenderness. There is no rebound and no guarding.  Musculoskeletal: She exhibits no edema or tenderness.  Lymphadenopathy:       Head (right side): No tonsillar adenopathy present.       Head (left side): No tonsillar adenopathy present.    She has no cervical adenopathy.    She has no axillary adenopathy.  Neurological: She is alert. Gait normal.  Skin: Rash (Approximately 6 2 x 2 centimeter hyperpigmented slightly indurated slightly erythematous lesions affecting both arms. Urticaria affecting chest.) noted. She is not diaphoretic. No erythema. No pallor. Nails show no clubbing.  Psychiatric: Mood and affect normal.     Diagnostics: Allergy skin tests were performed. She demonstrated hypersensitivity to grasses, weeds, molds, and house dust mite     Assessment and Plan:    1. Inflammatory dermatosis   2. Urticaria   3. Allergic rhinoconjunctivitis     1. Every day utilize the following medications:   A. cetirizine 10 mg one tablet twice a day  B. ranitidine 150 one tablet twice a day  C. montelukast 10 mg one tablet once a day  2. Can add over-the-counter Benadryl if needed  3. Blood tests - ANCA w / relex, SED, CRP, TSH, T4, thyroid peroxidase, urease breath test, CBC  with differential, CMP, urine analysis  4. Further evaluation? Biopsy?  5. Return to clinic in 3 weeks or earlier if problem  Loletha Carrow will utilize a combination of medical therapy in an attempt to calm down her immunological hyperreactivity. We'll have her undergo further evaluation regarding a systemic disease that could be contributing to her overactivity including possible Helicobacter pylori infection especially given the fact that she's been having problems with nausea. It is quite possible that she is having a reaction to her Fosamax or her supplements for calcium and vitamin D as these do temporally correlate with the onset of hyperreactivity and we may need to approach that issue as we move forward. I will see her back in this clinic in 3-4 weeks or earlier if there is a problem.  Jessica Priest, MD [facility]

## 2015-10-12 ENCOUNTER — Ambulatory Visit: Payer: Managed Care, Other (non HMO) | Admitting: Allergy and Immunology

## 2015-10-27 ENCOUNTER — Encounter: Payer: Self-pay | Admitting: *Deleted

## 2015-11-27 ENCOUNTER — Ambulatory Visit: Payer: Managed Care, Other (non HMO) | Admitting: Internal Medicine

## 2016-01-15 ENCOUNTER — Other Ambulatory Visit: Payer: Self-pay | Admitting: Internal Medicine

## 2016-02-21 ENCOUNTER — Other Ambulatory Visit: Payer: Self-pay | Admitting: *Deleted

## 2016-02-21 MED ORDER — MONTELUKAST SODIUM 10 MG PO TABS: ORAL_TABLET | ORAL | 0 refills | Status: DC

## 2016-05-29 ENCOUNTER — Other Ambulatory Visit: Payer: Self-pay | Admitting: Allergy and Immunology

## 2016-06-07 ENCOUNTER — Other Ambulatory Visit: Payer: Self-pay | Admitting: Internal Medicine

## 2016-08-19 ENCOUNTER — Other Ambulatory Visit: Payer: Self-pay | Admitting: Internal Medicine

## 2019-04-09 ENCOUNTER — Encounter: Payer: Self-pay | Admitting: Internal Medicine

## 2019-04-09 ENCOUNTER — Other Ambulatory Visit: Payer: Self-pay

## 2019-04-09 ENCOUNTER — Ambulatory Visit (INDEPENDENT_AMBULATORY_CARE_PROVIDER_SITE_OTHER): Payer: Managed Care, Other (non HMO) | Admitting: Internal Medicine

## 2019-04-09 ENCOUNTER — Ambulatory Visit (INDEPENDENT_AMBULATORY_CARE_PROVIDER_SITE_OTHER): Payer: Managed Care, Other (non HMO)

## 2019-04-09 VITALS — BP 118/78 | HR 81 | Temp 97.2°F | Ht 62.0 in | Wt 173.8 lb

## 2019-04-09 DIAGNOSIS — M25571 Pain in right ankle and joints of right foot: Secondary | ICD-10-CM | POA: Diagnosis not present

## 2019-04-09 DIAGNOSIS — M25572 Pain in left ankle and joints of left foot: Secondary | ICD-10-CM

## 2019-04-09 DIAGNOSIS — D860 Sarcoidosis of lung: Secondary | ICD-10-CM

## 2019-04-09 DIAGNOSIS — D869 Sarcoidosis, unspecified: Secondary | ICD-10-CM

## 2019-04-09 DIAGNOSIS — D863 Sarcoidosis of skin: Secondary | ICD-10-CM

## 2019-04-09 MED ORDER — HYDROXYCHLOROQUINE SULFATE 200 MG PO TABS: 200.0000 mg | ORAL_TABLET | Freq: Two times a day (BID) | ORAL | 3 refills | Status: DC

## 2019-04-09 NOTE — Patient Instructions (Signed)
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  -  Clinically in remission as of 3 years ago  But on prednisone 10mg  per day  - Possible lupus pernio (skin lesions in face) flare up with abrupt stopping of prednisone but could be unrelated to sarcoid skin issues as well  Plan  - reassess current sarcoid activity  - chceck CXR 2 view  - check blood ace level  - if above normal, we can reduce prednisone to 8mg  per day (gradual taper of prednisone best approach)  - respect  Flu shot deferral   Arthralgia of both ankles - check blood for G6PD - start plaquenil 200mg  twice daily  - refer rheumatology to see cause and If plaquenil strategy is correct  followup  = 3 months or sooner if needed

## 2019-04-09 NOTE — Addendum Note (Signed)
Addended by: Hildred Alamin I on: 04/09/2019 11:21 AM   Modules accepted: Orders

## 2019-04-09 NOTE — Addendum Note (Signed)
Addended by: Suzzanne Cloud E on: 04/09/2019 11:29 AM   Modules accepted: Orders

## 2019-04-09 NOTE — Progress Notes (Signed)
HPI  IOV  08/24/2015  Sarcoidosis    - Pulmonary Stage 1 leading into early stage 2:  ACE level > 100. EBUS diagnosis 11/21/11, AA female - Lupus pernio  Resolved Feb 2014 with prednisone 10 mg per day - Ankle arthropath  - Resolved February 2014 with prednisone 10 mg per day y  - Class B symptoms   - Resolved February 2014 with prednisone 10 mg per day  - Commenced prednisone 6/43/13 - Palpitations  - Cardiac evaluation normal cardiac MRI July 2013  - Normal sinus rhythm on Holter monitor July 2030     OV 08/11/2012 Followup sarcoidosis/ Mindy Brown -- She missed a 540-month appointment and is now here for six-month followup. Last seen 6 months ago. Currently taking prednisone at 10 mg per day. With this her ankle arthropathy lupus pernio in respiratory symptoms have completely resolved. She occasionally has palpitations despite being free of caffeine. She did see cardiology and had a normal cardiac MRI and normal Holter monitoring. Palpitations unexplained. It is associated with some chest tightness but no focal tenderness. She feels prednisone 10 mg per day should be her dose and she does not want to taper down further.  In terms of prednisone side effects she's had weight gain. Apparently this happened when she had her take Neurontin for neuropathy that was prescribed to the neurologist. So she stopped the Neurontin couple of months ago. She does not want to see the neurologist again. The neuropathy itself is improved rec #Sarcoidosis - Glad you're doing better in terms of neuropathy, palpitations, skin problems and respiratory issues - I have advised to decrease prednisone to 5 mg per day, but because you still do have some amount of tingling and some amount of palpitations that you feel is due to sarcoid we will continue as agreed the prednisone at 10 mg per day - I will see you in 6 months with spirometry at followup  #Long-term prednisone therapy  - Check vitamin D and  hemoglobin A1c level today  #Followup  - 6 months with spirometry at followup    05/25/2015  f/u ov/[name] re: steroid dep sarcoid  Chief Complaint  Patient presents with  . Follow-up    MR pt. Pt c/o wet cough with Brown/green mucus, some wheeze, dyspnea with exertion and mid-sternal chest pain that comes and goes.   symptoms are all chronic, not acute, attributed to sarcoid but on 20 mg prednisone s taper, each time tapers has skin flare assoc with aches in multiple joints esp ankles   No obvious day to day or daytime variability or assoc  chest tightness,   overt sinus or hb symptoms. No unusual exp hx or h/o childhood pna/ asthma or knowledge of premature birth.  Sleeping ok without nocturnal  or early am exacerbation  of respiratory  c/o's or need for noct saba. Also denies any obvious fluctuation of symptoms with weather or environmental changes or other aggravating or alleviating factors except as outlined above     OV 08/24/2015  Chief Complaint  Patient presents with  . Follow-up    Pt last seen by MR on 08/11/2012 then saw MW on 05/25/2015. Pt states she was bit by an insect on left arm 3 weeks ago, pt then developed hives that turned into bruising - pt unsure if the hives were related to her sarcoid. Pt states she has occasional SOB and prod cough with green/yellow mucus x 1 week. Pt denies CP/tightness and f/c/s.  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 Since then she's been on prednisone. She missed follow-up. She last saw my partner Dr. Sherene Sires in December 2016 and at that time was advised to taper her prednisone between 20 mg and 10 mg daily [prior to that she was on 20 mg daily]. He did a chest x-ray which are visualized today and it is normal and significantly improved compared to 2013. There is hyperinflation though. She says that with the prednisone her symptoms of dyspnea have mostly resolved.  However she continues to get mild arthralgia on and off. This is improved but the prednisone helps. Dr. Sherene Sires started her on Plaquenil in December 2016 which she says helps even more and his help her calm down on the prednisone. She is having financial issues with Plaquenil but manages. No fever or weight loss. Review of records shows that for her arthralgia she has not had autoimmune workup   new issue is that 3 weeks ago she got bit by presumably a spider in her left forearm or wrist. She had hives spreading up to the left arm shortly after that. Since then she has noticed easy bruising particularly in her contralateral right upper arm lateral aspect. She is due to see an allergist soon.   OV 04/09/2019  Subjective:  Patient ID: Mindy Brown, female , DOB: 07-Jan-1976 , age 43 y.o. , MRN: [id number] , ADDRESS: [address] [place] [address]   04/09/2019 -   Chief Complaint  Patient presents with  . Pulmonary Consult    Former patient here to get re-established. She reports that she has sob and occ. cough.     follow-up stage II pulmonary sarcoidosis with ;u[us pernip B symptoms and arthralgia - x 2013    HPI Mindy Brown 43 y.o. -presents for new evaluation for the above issues.  She is actually an established patient but it has been over 3 years since I last saw her.  Therefore she is a new patient per insurance criteria.  She diagnosis of pulmonary sarcoidosis stage II in 2013 associated with lupus pernio, ACE level greater than 100 and ankle arthralgia.  Back then she had cardiac evaluation for sarcoid and was ruled out.  She also recollects having had eye exam evaluation and this was ruled out as a lesion for sarcoid.  Last visit was in 2017.  At that time she had arthralgia but a clear chest x-ray.  She tells me that since 2013 she is been on prednisone 10 mg/day.  This is causing her weight gain and she does not want to be chronically dependent on prednisone.   Therefore she wants to taper this.  She says every now and then she tries to stop prednisone but her lupus pernio on the face comes back.  Last attempt was 4 to 5 months ago.  At the time she tried to quit prednisone cold [place].  And this is what she does every time.  And that she tries to quit prednisone cold [place].  She has been working in a nursing home and because of strict COVID-19 protocol she is been masking and this gives her a some shortness of breath and wheezing periodically.  She is worried that the pulmonary sarcoidosis is back.  Also she is continuing to have a significant amount of ankle arthralgia.  In December 2016 she took Plaquenil for a few months but she  does not recollect this.  Therefore she does not know of the Plaquenil helped ankle symptoms or not.  She is willing to give this a try again.  But the main goal is that she wants to come off prednisone if possible.  In 2017 we did do autoimmune blood work for her arthralgia and this was negative.  I shared these results with her.  She does not want to do a flu shot.  Results for Davene CostainLITTLE, Catie D (MRN [id number]) as of 04/09/2019 10:48  Ref. Range 10/28/2011 12:20  Angiotensin-Converting Enzyme Latest Ref Range: 8 - 52 U/L >100 (H)   Results for Davene CostainLITTLE, Mykel D (MRN [id number]) as of 04/09/2019 10:48  Ref. Range 08/24/2015 14:27  Anti Nuclear Antibody (ANA) Latest Ref Range: NEGATIVE  NEG  Cyclic Citrullin Peptide Ab Latest Units: Units <16  ds DNA Ab Latest Units: IU/mL <1  RA Latex Turbid. Latest Ref Range: <=14 IU/mL <10  SSA (Ro) (ENA) Antibody, IgG Latest Ref Range: <1.0 NEG AI  <1.0 NEG  SSB (La) (ENA) Antibody, IgG Latest Ref Range: <1.0 NEG AI  <1.0 NEG  Scleroderma (Scl-70) (ENA) Antibody, IgG Latest Ref Range: <1.0 NEG AI  <1.0 NEG  CLINICAL DATA:  Followup sarcoidosis. Substernal chest pain. History of asthma.  EXAM: CHEST  2 VIEW  COMPARISON:  11/22/2011  FINDINGS: Cardiac silhouette normal in size and  configuration. Normal mediastinal and hilar contours. No evidence of adenopathy.  Clear lungs.  No evidence of interstitial lung disease.  No pleural effusion or pneumothorax.  Skeletal structures are unremarkable.  IMPRESSION: Normal chest radiographs.   Electronically Signed   By: Amie Portlandavid  Ormond M.D.   On: 05/25/2015 19:26  ROS - per HPI     has a past medical history of Anemia, Asthma, Depression, GERD (gastroesophageal reflux disease), Headache(784.0), Heart murmur, Joint pain, Joint swelling, Nocturia, Osteoporosis, Pneumonia, Polyarthralgia, Rash, Sarcoidosis, Seasonal allergies, Shortness of breath, and Urticaria.   reports that she has never smoked. She has never used smokeless tobacco.  Past Surgical History:  Procedure Laterality Date  . CESAREAN SECTION  1995  . ESOPHAGOGASTRODUODENOSCOPY    . LUNG BIOPSY      No Known Allergies  Immunization History  Administered Date(s) Administered  . Influenza Split 03/25/2011, 04/24/2012  . Pneumococcal Polysaccharide-23 06/24/2009    Family History  Problem Relation Age of Onset  . Anesthesia problems Neg Hx   . Hypotension Neg Hx   . Malignant hyperthermia Neg Hx   . Pseudochol deficiency Neg Hx   . Breast cancer Mother   . High blood pressure Father   . High blood pressure Paternal Aunt   . High blood pressure Paternal Uncle   . Heart disease Paternal Grandmother   . Leukemia Paternal Grandmother   . High blood pressure Paternal Aunt      Current Outpatient Medications:  .  Calcium Carb-Cholecalciferol 600-800 MG-UNIT TABS, Take by mouth., Disp: , Rfl:  .  cetirizine (ZYRTEC) 10 MG tablet, Take 10 mg by mouth daily., Disp: , Rfl:  .  Cholecalciferol (VITAMIN D) 2000 UNITS CAPS, Take by mouth., Disp: , Rfl:  .  cyanocobalamin 500 MCG tablet, Take 500 mcg by mouth daily., Disp: , Rfl:  .  escitalopram (LEXAPRO) 10 MG tablet, escitalopram 10 mg tablet, Disp: , Rfl:  .  ferrous sulfate 324 MG TBEC,  Take 324 mg by mouth daily with breakfast., Disp: , Rfl:  .  LARISSIA 0.1-20 MG-MCG tablet, , Disp: , Rfl:  .  predniSONE (DELTASONE) 10 MG tablet, TAKE 1 TABLET BY MOUTH DAILY, Disp: 30 tablet, Rfl: 1  Review of systems: 11 point review of systems reviewed in detail and is negative other than as stated in the HPI.    Objective:   Vitals:   04/09/19 1023 04/09/19 1024  BP:  118/78  Pulse:  81  Temp: (!) 97.2 F (36.2 C)   TempSrc: Temporal   SpO2:  99%  Weight: 173 lb 12.8 oz (78.8 kg)   Height: 5\' 2"  (1.575 m)     Estimated body mass index is 31.79 kg/m as calculated from the following:   Height as of this encounter: 5\' 2"  (1.575 m).   Weight as of this encounter: 173 lb 12.8 oz (78.8 kg).  @WEIGHTCHANGE @  [employer]   04/09/19 1023  Weight: 173 lb 12.8 oz (78.8 kg)     Physical Exam  General Appearance:    Alert, cooperative, no distress, appears stated age - yes , Deconditioned looking - no , OBESE  - yes, Sitting on Wheelchair -  no  Head:    Normocephalic, without obvious abnormality, atraumatic  Eyes:    PERRL, conjunctiva/corneas clear,  Ears:    Normal TM's and external ear canals, both ears  Nose:   Nares normal, septum midline, mucosa normal, no drainage    or sinus tenderness. OXYGEN ON  - no . Patient is @ ra   Throat:   Lips, mucosa, and tongue normal; teeth and gums normal. Cyanosis on lips - no  Neck:   Supple, symmetrical, trachea midline, no adenopathy;    thyroid:  no enlargement/tenderness/nodules; no carotid   bruit or JVD  Back:     Symmetric, no curvature, ROM normal, no CVA tenderness  Lungs:     Distress - no , Wheeze no, Barrell Chest - no, Purse lip breathing - no, Crackles - no   Chest Wall:    No tenderness or deformity.    Heart:    Regular rate and rhythm, S1 and S2 normal, no rub   or gallop, Murmur - no  Breast Exam:    NOT DONE  Abdomen:     Soft, non-tender, bowel sounds active all four quadrants,    no masses, no organomegaly.  Visceral obesity - yes  Genitalia:   NOT DONE  Rectal:   NOT DONE  Extremities:   Extremities - normal, Has Cane - no, Clubbing - no, Edema - no  Pulses:   2+ and symmetric all extremities  Skin:   Stigmata of Connective Tissue Disease - no  Lymph nodes:   Cervical, supraclavicular, and axillary nodes normal  Psychiatric:  Neurologic:   Pleasant - yes, Anxious - no, Flat affect - no  CAm-ICU - neg, Alert and Oriented x 3 - yes, Moves all 4s - yes, Speech - normal, Cognition - intact           Assessment:       ICD-10-CM   1. 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 Arthralgia of both ankles  M25.571    M25.572        Plan:     Patient Instructions  Pulmonary sarcoidosis ([facility]) Sarcoidosis with lupus pernio  -  Clinically in remission as of 3 years ago  But on prednisone 10mg  per day  - Possible lupus pernio (skin lesions in face) flare up with abrupt stopping of prednisone but could be unrelated to  sarcoid skin issues as well  Plan  - reassess current sarcoid activity  - chceck CXR 2 view  - check blood ace level  - if above normal, we can reduce prednisone to  per day (gradual taper of prednisone best approach)  - respect  Flu shot deferral   Arthralgia of both ankles - check blood for G6PD - start plaquenil  twice daily  - refer rheumatology to see cause and If plaquenil strategy is correct  followup  = 3 months or sooner if needed   > 50% of this > 40 min visit spent in face to face counseling or/and coordination of care - by this undersigned MD - Dr Kalman Shan. This includes one or more of the following documented above: discussion of test results, diagnostic or treatment recommendations, prognosis, risks and benefits of management options, instructions, education, compliance or risk-factor reduction    SIGNATURE    Dr. Kalman Shan, M.D., F.C.C.P,  Pulmonary and Critical Care Medicine Staff  Physician, [facility] Director - Interstitial Lung Disease  Program  Pulmonary Fibrosis [facility] at [facility] [place], [place], [address]  Pager: [phone], If no answer or between  15:00h - 7:00h: call [phone] Telephone: [phone]  11:14 AM 04/09/2019

## 2019-04-12 ENCOUNTER — Telehealth: Payer: Self-pay | Admitting: Internal Medicine

## 2019-04-12 LAB — ANGIOTENSIN CONVERTING ENZYME: Angiotensin-Converting Enzyme: 26 U/L (ref 9–67)

## 2019-04-12 LAB — GLUCOSE 6 PHOSPHATE DEHYDROGENASE: G-6PDH: 15 U/g Hgb (ref 7.0–20.5)

## 2019-04-12 NOTE — Telephone Encounter (Signed)
Let [place] -know that the angiotensin-converting enzyme is now normal and a chest x-ray is clear.  This to me is reassuring that her sarcoidosis of the lung has improved/resolved  Plan -Reduce prednisone to 8 mg/day -Return to see me in 3 months -She really needs to keep up with regular follow-up so we can make headway into reducing prednisone   Dg Chest 2 View  Result Date: 04/09/2019 CLINICAL DATA:  Shortness of breath.  History of sarcoidosis EXAM: CHEST - 2 VIEW COMPARISON:  May 25, 2015 FINDINGS: The lungs are clear. Heart size and pulmonary vascularity are normal. No adenopathy. There is lower thoracic dextroscoliosis. IMPRESSION: Lungs clear.  No evident adenopathy. Electronically Signed   By: Lowella Grip III M.D.   On: 04/09/2019 11:29

## 2019-04-13 MED ORDER — PREDNISONE 1 MG PO TABS: 8.0000 mg | ORAL_TABLET | Freq: Every day | ORAL | 2 refills | Status: DC

## 2019-04-13 NOTE — Telephone Encounter (Signed)
Called and spoke to patient. Relayed results per Dr. Chase Caller. Patient verbalized understanding. Sent in new Rx for the decreased dose of Prenisone to patient's pharmacy of choice. Since Dr. Golden Pop schedule is not out 3 months, recall for 3 months placed. Nothing further needed at this time.

## 2019-04-14 NOTE — Progress Notes (Signed)
Office Visit Note  Patient: Mindy Brown             Date of Birth: [birth year]           MRN: [id number]             PCP: Galvin Proffer, MD Referring: Kalman Shan, MD Visit Date: 04/28/2019 Occupation: Automotive engineer  Subjective:  New Patient (Initial Visit) (JOINT PAIN)   History of Present Illness: Mindy Brown is a 43 y.o. female seen in consultation per request of Dr. Marchelle Gearing.  According to patient her symptoms are started about 10 years ago with shortness of breath and joint pain.  She states she was having ankle joint pain and swelling.  2 years later she was referred to Dr. Marchelle Gearing who did a lymph node biopsy and diagnosed her with sarcoidosis.  She states at the time she had skin involvement from sarcoidosis with rash on her face.  She also had a lot of joint pain and lung involvement.  She was placed on prednisone and which was gradually tapered.  She has been on prednisone for the last 8 years.  She states she was a started on Plaquenil about 3 years ago and then was discontinued.  She recently started Plaquenil again.  She states she has been having increased pain and swelling in her bilateral ankle joints which comes and goes.  She has been also having pain and discomfort in her left knee joint and her left shoulder joint.  None of the other joints are painful.  She has had no recurrence of rash.  Activities of Daily Living:  Patient reports morning stiffness for 2 minutes.   Patient Denies nocturnal pain.  Difficulty dressing/grooming: Denies Difficulty climbing stairs: Denies Difficulty getting out of chair: Denies Difficulty using hands for taps, buttons, cutlery, and/or writing: Denies  Review of Systems  Constitutional: Positive for fatigue. Negative for night sweats, weight gain and weight loss.  HENT: Negative for mouth sores, trouble swallowing, trouble swallowing, mouth dryness and nose dryness.   Eyes: Positive for dryness. Negative for  pain, redness and visual disturbance.  Respiratory: Positive for shortness of breath. Negative for cough and difficulty breathing.   Cardiovascular: Negative for chest pain, palpitations, hypertension and irregular heartbeat.  Gastrointestinal: Negative for blood in stool, constipation and diarrhea.  Endocrine: Positive for increased urination.  Genitourinary: Negative for difficulty urinating and vaginal dryness.  Musculoskeletal: Positive for arthralgias, joint pain, joint swelling and morning stiffness. Negative for gait problem, myalgias, muscle weakness, muscle tenderness and myalgias.  Skin: Negative for color change, rash, hair loss, skin tightness, ulcers and sensitivity to sunlight.  Allergic/Immunologic: Negative for susceptible to infections.  Neurological: Positive for numbness. Negative for dizziness, memory loss, night sweats and weakness.  Hematological: Negative for bruising/bleeding tendency and swollen glands.  Psychiatric/Behavioral: Positive for sleep disturbance. Negative for depressed mood. The patient is not nervous/anxious.     PMFS History:  Patient Active Problem List   Diagnosis Date Noted  . Arthralgia 08/24/2015  . Bruising 08/24/2015  . 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 Mediastinal lymphadenopathy 11/22/2011  . Pulmonary infiltrates 11/22/2011  . Sarcoidosis (HCC) 10/31/2011  . Heart palpitations 10/31/2011    Past Medical History:  Diagnosis Date  . Anemia    takes Hemocyte plus  . Asthma    doesn't require any meds;dx in 2003  . Depression    was on celexa for a short period of time;has been off x 65yrs  .  GERD (gastroesophageal reflux disease)    doesn't require any meds at present  . Headache(784.0)   . Heart murmur    just found out about this in 04/2011  . Joint pain   . Joint swelling   . Nocturia   . Osteoporosis   . Pneumonia    hx of;in 1999  . Polyarthralgia   . Rash    on face  . Sarcoidosis   . Seasonal  allergies   . Shortness of breath    sitting/lying/standing  . Urticaria     Family History  Problem Relation Age of Onset  . Breast cancer Mother   . Squamous cell carcinoma Mother   . High blood pressure Father   . High blood pressure Paternal Aunt   . High blood pressure Paternal Uncle   . Heart disease Paternal Grandmother   . Leukemia Paternal Grandmother   . High blood pressure Paternal Aunt   . Atrial fibrillation Brother   . Congestive Heart Failure Brother   . Anesthesia problems Neg Hx   . Hypotension Neg Hx   . Malignant hyperthermia Neg Hx   . Pseudochol deficiency Neg Hx    Past Surgical History:  Procedure Laterality Date  . CESAREAN SECTION  1995  . ESOPHAGOGASTRODUODENOSCOPY    . LUNG BIOPSY     Social History   Social History Narrative  . Not on file   Immunization History  Administered Date(s) Administered  . Influenza Split 03/25/2011, 04/24/2012  . Pneumococcal Polysaccharide-23 06/24/2009     Objective: Vital Signs: BP 115/79 (BP Location: Right Arm, Patient Position: Sitting, Cuff Size: Normal)   Pulse 81   Resp 14   Ht 5\' 2"  (1.575 m)   Wt 176 lb 6.4 oz (80 kg)   BMI 32.26 kg/m    Physical Exam Vitals signs and nursing note reviewed.  Constitutional:      Appearance: She is well-developed.  HENT:     Head: Normocephalic and atraumatic.  Eyes:     Conjunctiva/sclera: Conjunctivae normal.  Neck:     Musculoskeletal: Normal range of motion.  Cardiovascular:     Rate and Rhythm: Normal rate and regular rhythm.     Heart sounds: Normal heart sounds.  Pulmonary:     Effort: Pulmonary effort is normal.     Breath sounds: Normal breath sounds.  Abdominal:     General: Bowel sounds are normal.     Palpations: Abdomen is soft.  Lymphadenopathy:     Cervical: No cervical adenopathy.  Skin:    General: Skin is warm and dry.     Capillary Refill: Capillary refill takes less than 2 seconds.  Neurological:     Mental Status: She is  alert and oriented to person, place, and time.  Psychiatric:        Behavior: Behavior normal.      Musculoskeletal Exam: C-spine thoracic and lumbar spine were in good range of motion.  Shoulder joints were in good range of motion.  She had discomfort range of motion of her left shoulder joint.  Elbow joints wrist joint MCPs PIPs DIPs with good range of motion with no synovitis.  She had good range of motion of her hip joints and her knee joints.  She has warmth on palpation of her left knee joint.  Ankle joints and MTPs and PIPs with good range of motion with no synovitis.  She has tenderness on palpation of bilateral ankle joints and warmth on palpation of bilateral ankle  joints.  CDAI Exam: CDAI Score: - Patient Global: -; Provider Global: - Swollen: -; Tender: - Joint Exam   No joint exam has been documented for this visit   There is currently no information documented on the homunculus. Go to the Rheumatology activity and complete the homunculus joint exam.  Investigation: Findings:  08/24/15: ANA-, dsDNA-, RF<10, CCP<16, Ro-, La-, Scl-70 negative  04/09/19: G6PDH 15, Ace 26  Component     Latest Ref Rng & Units 08/24/2015  Anti Nuclear Antibody (ANA)     NEGATIVE NEG  ds DNA Ab     IU/mL <1  RA Latex Turbid.     <=96 IU/mL <78  Cyclic Citrullin Peptide Ab     Units <16  SSA (Ro) (ENA) Antibody, IgG     <1.0 NEG AI <1.0 NEG  SSB (La) (ENA) Antibody, IgG     <1.0 NEG AI <1.0 NEG  Scleroderma (Scl-70) (ENA) Antibody, IgG     <1.0 NEG AI <1.0 NEG   Component     Latest Ref Rng & Units 04/09/2019  G-6PDH     7.0 - 20.5 U/g Hgb 15.0  Angiotensin-Converting Enzyme     9 - 67 U/L 26   Imaging: Dg Chest 2 View  Result Date: 04/09/2019 CLINICAL DATA:  Shortness of breath.  History of sarcoidosis EXAM: CHEST - 2 VIEW COMPARISON:  May 25, 2015 FINDINGS: The lungs are clear. Heart size and pulmonary vascularity are normal. No adenopathy. There is lower thoracic  dextroscoliosis. IMPRESSION: Lungs clear.  No evident adenopathy. Electronically Signed   By: Lowella Grip III M.D.   On: 04/09/2019 11:29   Xr Ankle 2 Views Left  Result Date: 04/28/2019 No tibiotalar or subtalar joint space narrowing was noted.  No erosive changes were noted. Impression: Unremarkable x-ray of the ankle joint.  Xr Ankle 2 Views Right  Result Date: 04/28/2019 No tibiotalar or subtalar joint space narrowing was noted.  No erosive changes were noted. Impression: Unremarkable x-ray of the ankle joint.  Xr Knee 3 View Left  Result Date: 04/28/2019 No medial lateral compartment narrowing was noted.  No chondrocalcinosis was noted.  Moderate patellofemoral narrowing was noted. 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  Xr Shoulder Left  Result Date: 04/28/2019 No glenohumeral or acromioclavicular joint space narrowing was noted.  No erosive changes were noted.  No chondrocalcinosis was noted. Unremarkable x-ray of the shoulder joint.   Recent Labs: Lab Results  Component Value Date   WBC 6.8 05/25/2015   HGB 11.6 (L) 05/25/2015   PLT 296.0 05/25/2015   NA 138 05/25/2015   K 4.0 05/25/2015   CL 105 05/25/2015   CO2 26 05/25/2015   GLUCOSE 93 05/25/2015   BUN 14 05/25/2015   CREATININE 0.65 05/25/2015   BILITOT 0.4 11/20/2011   ALKPHOS 43 11/20/2011   AST 31 11/20/2011   ALT 20 11/20/2011   PROT 7.9 11/20/2011   ALBUMIN 3.3 (L) 11/20/2011   CALCIUM 9.3 05/25/2015   GFRAA >90 11/20/2011    Speciality Comments: No specialty comments available.  Procedures:  No procedures performed Allergies: Patient has no known allergies.   Assessment / Plan:     Visit Diagnoses: Sarcoidosis (HCC)-patient is known history of sarcoidosis for the last 8 years diagnosed by Dr. Chase Caller based on the biopsy.  She has been on prednisone for 8 years now.  She had intermittently been on Plaquenil for the last 3  years.  Mediastinal lymphadenopathy  Pulmonary infiltrates-treated with prednisone.  Chronic left shoulder pain -she has painful range of motion of her left shoulder joint.  Plan: XR Shoulder Left.  The x-ray of the shoulder joint was unremarkable.  Chronic pain of left knee -she had discomfort and warmth on palpation of her left knee joint.  Plan: XR KNEE 3 VIEW LEFT.  The x-ray showed moderate chondromalacia patella.  Chronic pain of both ankles -she gives history of intermittent swelling in her bilateral ankles.  She had warmth on palpation of her ankle joints.- Plan: XR Ankle 2 Views Right, XR Ankle 2 Views Left, x-ray of bilateral ankle joints were unremarkable.  Sedimentation rate, Cyclic citrul peptide antibody, IgG, Rheumatoid factor  High risk medication use -I discussed possible use of methotrexate which will be more effective for her arthritis associated with sarcoidosis.  She uses contraception and denies alcohol consumption.  I will obtain following labs today.  Plan: CBC with Differential/Platelet, COMPLETE METABOLIC PANEL WITH GFR, Hepatitis B core antibody, IgM, Hepatitis B surface antigen, Hepatitis C antibody, HIV Antibody (routine testing w rflx), QuantiFERON-TB Gold Plus, Serum protein electrophoresis with reflex, IgG, IgA, IgM.  Detailed counseling guarding methotrexate was provided.  Indications side effects contraindications were discussed.  She is ready to proceed with the methotrexate.  Once her labs are available then we can start her on methotrexate 6 tablets p.o. weekly along with folic acid 2 mg p.o. daily and increase the dose to 8 tablets p.o. weekly if the labs are normal.  We will monitor labs every 2 months and then every 3 months for the stable.  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 XR Shoulder Left  . XR KNEE 3 VIEW LEFT  . XR Ankle 2 Views Right  . XR Ankle 2 Views Left  . CBC with  Differential/Platelet  . COMPLETE METABOLIC PANEL WITH GFR  . Sedimentation rate  . Hepatitis B core antibody, IgM  . Hepatitis B surface antigen  . Hepatitis C antibody  . HIV Antibody (routine testing w rflx)  . QuantiFERON-TB Gold Plus  . Serum protein electrophoresis with reflex  . IgG, IgA, IgM  . Cyclic citrul peptide antibody, IgG  . Rheumatoid factor   No orders of the defined types were placed in this encounter.     Follow-Up Instructions: Return for Sarcoidosis.   Pollyann Savoy, MD  Note - This record has been created using Animal nutritionist.  Chart creation errors have been sought, but may not always  have been located. Such creation errors do not reflect on  the standard of medical care.

## 2019-04-28 ENCOUNTER — Ambulatory Visit (INDEPENDENT_AMBULATORY_CARE_PROVIDER_SITE_OTHER): Payer: Managed Care, Other (non HMO)

## 2019-04-28 ENCOUNTER — Ambulatory Visit (INDEPENDENT_AMBULATORY_CARE_PROVIDER_SITE_OTHER): Payer: Managed Care, Other (non HMO) | Admitting: Rheumatology

## 2019-04-28 ENCOUNTER — Telehealth: Payer: Self-pay | Admitting: Pharmacist

## 2019-04-28 ENCOUNTER — Other Ambulatory Visit: Payer: Self-pay

## 2019-04-28 ENCOUNTER — Ambulatory Visit: Payer: Self-pay

## 2019-04-28 ENCOUNTER — Encounter: Payer: Self-pay | Admitting: Rheumatology

## 2019-04-28 VITALS — BP 115/79 | HR 81 | Resp 14 | Ht 62.0 in | Wt 176.4 lb

## 2019-04-28 DIAGNOSIS — M25512 Pain in left shoulder: Secondary | ICD-10-CM

## 2019-04-28 DIAGNOSIS — R918 Other nonspecific abnormal finding of lung field: Secondary | ICD-10-CM

## 2019-04-28 DIAGNOSIS — Z79899 Other long term (current) drug therapy: Secondary | ICD-10-CM

## 2019-04-28 DIAGNOSIS — D869 Sarcoidosis, unspecified: Secondary | ICD-10-CM | POA: Diagnosis not present

## 2019-04-28 DIAGNOSIS — M25572 Pain in left ankle and joints of left foot: Secondary | ICD-10-CM

## 2019-04-28 DIAGNOSIS — M25571 Pain in right ankle and joints of right foot: Secondary | ICD-10-CM | POA: Diagnosis not present

## 2019-04-28 DIAGNOSIS — M25562 Pain in left knee: Secondary | ICD-10-CM

## 2019-04-28 DIAGNOSIS — R590 Localized enlarged lymph nodes: Secondary | ICD-10-CM | POA: Diagnosis not present

## 2019-04-28 DIAGNOSIS — G8929 Other chronic pain: Secondary | ICD-10-CM | POA: Diagnosis not present

## 2019-04-28 DIAGNOSIS — R002 Palpitations: Secondary | ICD-10-CM

## 2019-04-28 DIAGNOSIS — D8689 Sarcoidosis of other sites: Secondary | ICD-10-CM

## 2019-04-28 NOTE — Progress Notes (Signed)
Pharmacy Note  Subjective: Patient presents today to the [facility] to see Dr. Estanislado Pandy.  Patient seen by the pharmacist for counseling on methotrexate for sarcoidosis. Prior therapy includes:Plaquenil.  Objective: CBC    Component Value Date/Time   WBC 6.8 05/25/2015 1534   RBC 4.12 05/25/2015 1534   HGB 11.6 (L) 05/25/2015 1534   HCT 35.5 (L) 05/25/2015 1534   PLT 296.0 05/25/2015 1534   MCV 86.2 05/25/2015 1534   MCH 27.0 11/20/2011 1626   MCHC 32.8 05/25/2015 1534   RDW 15.3 05/25/2015 1534   LYMPHSABS 0.8 05/25/2015 1534   MONOABS 0.4 05/25/2015 1534   EOSABS 0.0 05/25/2015 1534   BASOSABS 0.0 05/25/2015 1534    CMP     Component Value Date/Time   NA 138 05/25/2015 1534   K 4.0 05/25/2015 1534   CL 105 05/25/2015 1534   CO2 26 05/25/2015 1534   GLUCOSE 93 05/25/2015 1534   BUN 14 05/25/2015 1534   CREATININE 0.65 05/25/2015 1534   CALCIUM 9.3 05/25/2015 1534   PROT 7.9 11/20/2011 1626   ALBUMIN 3.3 (L) 11/20/2011 1626   AST 31 11/20/2011 1626   ALT 20 11/20/2011 1626   ALKPHOS 43 11/20/2011 1626   BILITOT 0.4 11/20/2011 1626   GFRNONAA >90 11/20/2011 1626   GFRAA >90 11/20/2011 1626    Baseline Immunosuppressant Therapy Labs TB GOLD: pending 04/28/2019  Hepatitis Panel: pending 04/28/2019  HIV: pending 04/28/2019  Immunoglobulins: pending  04/28/2019  SPEP: pending 04/28/2019  G6PD Lab Results  Component Value Date   G6PDH 15.0 04/09/2019   TPMT No results found for: TPMT   Chest-xray: normal on 05/25/2015  Contraception: on birth control  Alcohol use: a few drinks a week.  Patient advised of ACR recommendation to limit to 1 glass a week while on methotrexate due to increased risk of liver injury.  Assessment/Plan:   Patient was counseled on the purpose, proper use, and adverse effects of methotrexate including nausea, infection, and signs and symptoms of pneumonitis. Discussed that there is the possibility of an increased risk of  malignancy, specifically lymphomas, but it is not well understood if this increased risk is due to the medication or the disease state.  Instructed patient that medication should be held for infection and prior to surgery.  Advised patient to avoid live vaccines. Recommend annual influenza, Pneumovax 23, Prevnar 13, and Shingrix as indicated.   Reviewed instructions with patient to take methotrexate weekly along with folic acid daily.  Discussed the importance of frequent monitoring of kidney and liver function and blood counts, and provided patient with standing lab instructions.  Counseled patient to avoid NSAIDs and alcohol while on methotrexate.  Provided patient with educational materials on methotrexate and answered all questions.   Patient voiced understanding.  Patient consented to methotrexate use.  Will upload into chart.    Dose of methotrexate will be methotrexate 6 tablets every 7 days for 2 weeks then methotrexate 8 tablets every 7 days as tolerated along with folic acid 2 mg  daily. Prescription pending lab results.  All questions encouraged and answered.  Instructed patient to call with any questions or concerns.  Mariella Saa, PharmD, [facility] Clinical Specialty Pharmacist [phone]  04/28/2019 11:35 AM

## 2019-04-28 NOTE — Telephone Encounter (Signed)
Dose of methotrexate will be methotrexate 6 tablets every 7 days for 2 weeks then methotrexate 8 tablets every 7 days as tolerated along with folic acid 2 mg  daily. Prescription pending lab results.   Mariella Saa, PharmD, [facility] [place] Clinical Specialty Pharmacist [phone]  04/28/2019 12:21 PM

## 2019-04-28 NOTE — Patient Instructions (Signed)
Standing Labs We placed an order today for your standing lab work.    Please come back and get your standing labs in 2 weeks, 4 weeks, 8 weeks, then every 3 months.  We have open lab daily Monday through Thursday from 8:30-12:30 PM and 1:30-4:30 PM and Friday from 8:30-12:30 PM and 1:30-4:00 PM at the office of Dr. Bo Merino.   You may experience shorter wait times on Monday and Friday afternoons. The office is located at [address], [place], [place], [place] [address] No appointment is necessary.   Labs are drawn by [facility].  You may receive a bill from [facility] for your lab work.  If you wish to have your labs drawn at another location, please call the office 24 hours in advance to send orders.  If you have any questions regarding directions or hours of operation,  please call [phone].   Just as a reminder please drink plenty of water prior to coming for your lab work. Thanks!  Vaccines You are taking a medication(s) that can suppress your immune system.  The following immunizations are recommended: . Flu annually . Pneumonia (Pneumovax 23 and Prevnar 13 spaced at least 1 year apart) . Shingrix  Please check with your PCP to make sure you are up to date.  Methotrexate tablets What is this medicine? METHOTREXATE (METH oh TREX ate) is a chemotherapy drug used to treat cancer including breast cancer, leukemia, and lymphoma. This medicine can also be used to treat psoriasis and certain kinds of arthritis. This medicine may be used for other purposes; ask your health care provider or pharmacist if you have questions. COMMON BRAND NAME(S): Rheumatrex, Trexall What should I tell my health care provider before I take this medicine? They need to know if you have any of these conditions:  fluid in the stomach area or lungs  if you often drink alcohol  infection or immune system problems  kidney disease or on hemodialysis  liver disease  low blood counts, like low  white cell, platelet, or red cell counts  lung disease  radiation therapy  stomach ulcers  ulcerative colitis  an unusual or allergic reaction to methotrexate, other medicines, foods, dyes, or preservatives  pregnant or trying to get pregnant  breast-feeding How should I use this medicine? Take this medicine by mouth with a glass of water. Follow the directions on the prescription label. Take your medicine at regular intervals. Do not take it more often than directed. Do not stop taking except on your doctor's advice. Make sure you know why you are taking this medicine and how often you should take it. If this medicine is used for a condition that is not cancer, like arthritis or psoriasis, it should be taken weekly, NOT daily. Taking this medicine more often than directed can cause serious side effects, even death. Talk to your healthcare provider about safe handling and disposal of this medicine. You may need to take special precautions. Talk to your pediatrician regarding the use of this medicine in children. While this drug may be prescribed for selected conditions, precautions do apply. Overdosage: If you think you have taken too much of this medicine contact a poison control center or emergency room at once. NOTE: This medicine is only for you. Do not share this medicine with others. What if I miss a dose? If you miss a dose, talk with your doctor or health care professional. Do not take double or extra doses. What may interact with this medicine?  This medicine may interact with the following medication:  acitretin  aspirin and aspirin-like medicines including salicylates  azathioprine  certain antibiotics like penicillins, tetracycline, and chloramphenicol  cyclosporine  gold  hydroxychloroquine  live virus vaccines  NSAIDs, medicines for pain and inflammation, like ibuprofen or naproxen  other cytotoxic  agents  penicillamine  phenylbutazone  phenytoin  probenecid  retinoids such as isotretinoin and tretinoin  steroid medicines like prednisone or cortisone  sulfonamides like sulfasalazine and trimethoprim/sulfamethoxazole  theophylline This list may not describe all possible interactions. Give your health care provider a list of all the medicines, herbs, non-prescription drugs, or dietary supplements you use. Also tell them if you smoke, drink alcohol, or use illegal drugs. Some items may interact with your medicine. What should I watch for while using this medicine? Avoid alcoholic drinks. This medicine can make you more sensitive to the sun. Keep out of the sun. If you cannot avoid being in the sun, wear protective clothing and use sunscreen. Do not use sun lamps or tanning beds/booths. You may need blood work done while you are taking this medicine. Call your doctor or health care professional for advice if you get a fever, chills or sore throat, or other symptoms of a cold or flu. Do not treat yourself. This drug decreases your body's ability to fight infections. Try to avoid being around people who are sick. This medicine may increase your risk to bruise or bleed. Call your doctor or health care professional if you notice any unusual bleeding. Check with your doctor or health care professional if you get an attack of severe diarrhea, nausea and vomiting, or if you sweat a lot. The loss of too much body fluid can make it dangerous for you to take this medicine. Talk to your doctor about your risk of cancer. You may be more at risk for certain types of cancers if you take this medicine. Both men and women must use effective birth control with this medicine. Do not become pregnant while taking this medicine or until at least 1 normal menstrual cycle has occurred after stopping it. Women should inform their doctor if they wish to become pregnant or think they might be pregnant. Men should  not father a child while taking this medicine and for 3 months after stopping it. There is a potential for serious side effects to an unborn child. Talk to your health care professional or pharmacist for more information. Do not breast-feed an infant while taking this medicine. What side effects may I notice from receiving this medicine? Side effects that you should report to your doctor or health care professional as soon as possible:  allergic reactions like skin rash, itching or hives, swelling of the face, lips, or tongue  breathing problems or shortness of breath  diarrhea  dry, nonproductive cough  low blood counts - this medicine may decrease the number of white blood cells, red blood cells and platelets. You may be at increased risk for infections and bleeding.  mouth sores  redness, blistering, peeling or loosening of the skin, including inside the mouth  signs of infection - fever or chills, cough, sore throat, pain or trouble passing urine  signs and symptoms of bleeding such as bloody or black, tarry stools; red or dark-brown urine; spitting up blood or brown material that looks like coffee grounds; red spots on the skin; unusual bruising or bleeding from the eye, gums, or nose  signs and symptoms of kidney injury like trouble passing urine  or change in the amount of urine  signs and symptoms of liver injury like dark yellow or brown urine; general ill feeling or flu-like symptoms; light-colored stools; loss of appetite; nausea; right upper belly pain; unusually weak or tired; yellowing of the eyes or skin Side effects that usually do not require medical attention (report to your doctor or health care professional if they continue or are bothersome):  dizziness  hair loss  tiredness  upset stomach  vomiting This list may not describe all possible side effects. Call your doctor for medical advice about side effects. You may report side effects to FDA at  1-800-FDA-1088. Where should I keep my medicine? Keep out of the reach of children. Store at room temperature between 20 and 25 degrees C (68 and 77 degrees F). Protect from light. Throw away any unused medicine after the expiration date. NOTE: This sheet is a summary. It may not cover all possible information. If you have questions about this medicine, talk to your doctor, pharmacist, or health care provider.  2020 Elsevier/Gold Standard (2017-01-30 13:38:43)

## 2019-04-30 LAB — PROTEIN ELECTROPHORESIS, SERUM, WITH REFLEX
Albumin ELP: 4.2 g/dL (ref 3.8–4.8)
Alpha 1: 0.3 g/dL (ref 0.2–0.3)
Alpha 2: 0.6 g/dL (ref 0.5–0.9)
Beta 2: 0.4 g/dL (ref 0.2–0.5)
Beta Globulin: 0.5 g/dL (ref 0.4–0.6)
Gamma Globulin: 1.6 g/dL (ref 0.8–1.7)
Total Protein: 7.6 g/dL (ref 6.1–8.1)

## 2019-04-30 LAB — QUANTIFERON-TB GOLD PLUS
Mitogen-NIL: 8.37 IU/mL
NIL: 0.03 IU/mL
QuantiFERON-TB Gold Plus: NEGATIVE
TB1-NIL: 0 IU/mL
TB2-NIL: 0 IU/mL

## 2019-04-30 LAB — COMPLETE METABOLIC PANEL WITH GFR
AG Ratio: 1.3 (calc) (ref 1.0–2.5)
ALT: 14 U/L (ref 6–29)
AST: 19 U/L (ref 10–30)
Albumin: 4.3 g/dL (ref 3.6–5.1)
Alkaline phosphatase (APISO): 29 U/L — ABNORMAL LOW (ref 31–125)
BUN: 10 mg/dL (ref 7–25)
CO2: 23 mmol/L (ref 20–32)
Calcium: 9.3 mg/dL (ref 8.6–10.2)
Chloride: 103 mmol/L (ref 98–110)
Creat: 0.73 mg/dL (ref 0.50–1.10)
GFR, Est African American: 119 mL/min/{1.73_m2} (ref >=60)
GFR, Est Non African American: 102 mL/min/{1.73_m2} (ref >=60)
Globulin: 3.4 g/dL (calc) (ref 1.9–3.7)
Glucose, Bld: 89 mg/dL (ref 65–99)
Potassium: 3.5 mmol/L (ref 3.5–5.3)
Sodium: 140 mmol/L (ref 135–146)
Total Bilirubin: 0.6 mg/dL (ref 0.2–1.2)
Total Protein: 7.7 g/dL (ref 6.1–8.1)

## 2019-04-30 LAB — CBC WITH DIFFERENTIAL/PLATELET
Absolute Monocytes: 305 cells/uL (ref 200–950)
Basophils Absolute: 44 cells/uL (ref 0–200)
Basophils Relative: 0.4 %
Eosinophils Absolute: 22 cells/uL (ref 15–500)
Eosinophils Relative: 0.2 %
HCT: 36.4 % (ref 35.0–45.0)
Hemoglobin: 11.7 g/dL (ref 11.7–15.5)
Lymphs Abs: 828 cells/uL — ABNORMAL LOW (ref 850–3900)
MCH: 29.4 pg (ref 27.0–33.0)
MCHC: 32.1 g/dL (ref 32.0–36.0)
MCV: 91.5 fL (ref 80.0–100.0)
MPV: 10.7 fL (ref 7.5–12.5)
Monocytes Relative: 2.8 %
Neutro Abs: 9701 cells/uL — ABNORMAL HIGH (ref 1500–7800)
Neutrophils Relative %: 89 %
Platelets: 334 10*3/uL (ref 140–400)
RBC: 3.98 10*6/uL (ref 3.80–5.10)
RDW: 14.6 % (ref 11.0–15.0)
Total Lymphocyte: 7.6 %
WBC: 10.9 10*3/uL — ABNORMAL HIGH (ref 3.8–10.8)

## 2019-04-30 LAB — HIV ANTIBODY (ROUTINE TESTING W REFLEX): HIV 1&2 Ab, 4th Generation: NONREACTIVE

## 2019-04-30 LAB — HEPATITIS C ANTIBODY
Hepatitis C Ab: NONREACTIVE
SIGNAL TO CUT-OFF: 0.03 (ref <1.00)

## 2019-04-30 LAB — IGG, IGA, IGM
IgG (Immunoglobin G), Serum: 1654 mg/dL — ABNORMAL HIGH (ref 600–1640)
IgM, Serum: 209 mg/dL (ref 50–300)
Immunoglobulin A: 220 mg/dL (ref 47–310)

## 2019-04-30 LAB — CYCLIC CITRUL PEPTIDE ANTIBODY, IGG: Cyclic Citrullin Peptide Ab: <16 UNITS

## 2019-04-30 LAB — RHEUMATOID FACTOR: Rheumatoid fact SerPl-aCnc: <14 IU/mL (ref <14)

## 2019-04-30 LAB — HEPATITIS B SURFACE ANTIGEN: Hepatitis B Surface Ag: NONREACTIVE

## 2019-04-30 LAB — SEDIMENTATION RATE: Sed Rate: 9 mm/h (ref 0–20)

## 2019-04-30 LAB — HEPATITIS B CORE ANTIBODY, IGM: Hep B C IgM: NONREACTIVE

## 2019-05-02 NOTE — Progress Notes (Signed)
Plan is to start patient on methotrexate once all the results are available.  Please check if we have a consent from the patient.

## 2019-05-03 NOTE — Telephone Encounter (Signed)
Reviewed lab results with patient. She consented to methotrexate at last visit but is still hesitant to start after speaking with her family.  Instructed patient to call when she is ready to start or any questions/concerns I can address.  Mariella Saa, PharmD, [place], [place] [facility]ty Pharmacist [phone]  05/03/2019 10:29 AM

## 2019-05-11 NOTE — Progress Notes (Deleted)
Office Visit Note  Patient: Mindy Brown             Date of Birth: 08-16-1975           MRN: [id number]             PCP: Bonnita Nasuti, MD Referring: Bonnita Nasuti, MD Visit Date: 05/25/2019 Occupation: @GUAROCC @  Subjective:  No chief complaint on file.   History of Present Illness: Mindy Brown is a 43 y.o. female ***   Activities of Daily Living:  Patient reports morning stiffness for *** {minute/hour:19697}.   Patient {ACTIONS;DENIES/REPORTS:21021675::"Denies"} nocturnal pain.  Difficulty dressing/grooming: {ACTIONS;DENIES/REPORTS:21021675::"Denies"} Difficulty climbing stairs: {ACTIONS;DENIES/REPORTS:21021675::"Denies"} Difficulty getting out of chair: {ACTIONS;DENIES/REPORTS:21021675::"Denies"} Difficulty using hands for taps, buttons, cutlery, and/or writing: {ACTIONS;DENIES/REPORTS:21021675::"Denies"}  No Rheumatology ROS completed.   PMFS History:  Patient Active Problem List   Diagnosis Date Noted  . Arthralgia 08/24/2015  . Bruising 08/24/2015  . 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 Mediastinal lymphadenopathy 11/22/2011  . Pulmonary infiltrates 11/22/2011  . Sarcoidosis ([place]) 10/31/2011  . Heart palpitations 10/31/2011    Past Medical History:  Diagnosis Date  . Anemia    takes Hemocyte plus  . Asthma    doesn't require any meds;dx in 2003  . Depression    was on celexa for a short period of time;has been off x 47yr  . GERD (gastroesophageal reflux disease)    doesn't require any meds at present  . Headache(784.0)   . Heart murmur    just found out about this in 04/2011  . Joint pain   . Joint swelling   . Nocturia   . Osteoporosis   . Pneumonia    hx of;in 1999  . Polyarthralgia   . Rash    on face  . Sarcoidosis   . Seasonal allergies   . Shortness of breath    sitting/lying/standing  . Urticaria     Family History  Problem Relation Age of Onset  . Breast cancer Mother   . Squamous cell carcinoma Mother    . High blood pressure Father   . High blood pressure Paternal Aunt   . High blood pressure Paternal Uncle   . Heart disease Paternal Grandmother   . Leukemia Paternal Grandmother   . High blood pressure Paternal Aunt   . Atrial fibrillation Brother   . Congestive Heart Failure Brother   . Anesthesia problems Neg Hx   . Hypotension Neg Hx   . Malignant hyperthermia Neg Hx   . Pseudochol deficiency Neg Hx    Past Surgical History:  Procedure Laterality Date  . CESAREAN SECTION  1995  . ESOPHAGOGASTRODUODENOSCOPY    . LUNG BIOPSY     Social History   Social History Narrative  . Not on file   Immunization History  Administered Date(s) Administered  . Influenza Split 03/25/2011, 04/24/2012  . Pneumococcal Polysaccharide-23 06/24/2009     Objective: Vital Signs: There were no vitals taken for this visit.   Physical Exam   Musculoskeletal Exam: ***  CDAI Exam: CDAI Score: - Patient Global: -; Provider Global: - Swollen: -; Tender: - Joint Exam   No joint exam has been documented for this visit   There is currently no information documented on the homunculus. Go to the Rheumatology activity and complete the homunculus joint exam.  Investigation: No additional findings.  Imaging: Xr Ankle 2 Views Left  Result Date: 04/28/2019 No tibiotalar or subtalar joint space narrowing was noted.  No erosive changes  were noted. Impression: Unremarkable x-ray of the ankle joint.  Xr Ankle 2 Views Right  Result Date: 04/28/2019 No tibiotalar or subtalar joint space narrowing was noted.  No erosive changes were noted. Impression: Unremarkable x-ray of the ankle joint.  Xr Knee 3 View Left  Result Date: 04/28/2019 No medial lateral compartment narrowing was noted.  No chondrocalcinosis was noted.  Moderate patellofemoral narrowing was noted. 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  Xr Shoulder Left  Result Date: 04/28/2019 No  glenohumeral or acromioclavicular joint space narrowing was noted.  No erosive changes were noted.  No chondrocalcinosis was noted. Unremarkable x-ray of the shoulder joint.   Recent Labs: Lab Results  Component Value Date   WBC 10.9 (H) 04/28/2019   HGB 11.7 04/28/2019   PLT 334 04/28/2019   NA 140 04/28/2019   K 3.5 04/28/2019   CL 103 04/28/2019   CO2 23 04/28/2019   GLUCOSE 89 04/28/2019   BUN 10 04/28/2019   CREATININE 0.73 04/28/2019   BILITOT 0.6 04/28/2019   ALKPHOS 43 11/20/2011   AST 19 04/28/2019   ALT 14 04/28/2019   PROT 7.7 04/28/2019   PROT 7.6 04/28/2019   ALBUMIN 3.3 (L) 11/20/2011   CALCIUM 9.3 04/28/2019   GFRAA 119 04/28/2019   QFTBGOLDPLUS NEGATIVE 04/28/2019  April 28, 2019 SPEP normal, IgG elevated, TB: Negative, hepatitis B-, hepatitis C negative, HIV negative, ESR 9, RF negative, anti-CCP negative  Speciality Comments: No specialty comments available.  Procedures:  No procedures performed Allergies: Patient has no known allergies.   Assessment / Plan:     Visit Diagnoses: No diagnosis found.  Orders: No orders of the defined types were placed in this encounter.  No orders of the defined types were placed in this encounter.   Face-to-face time spent with patient was *** minutes. Greater than 50% of time was spent in counseling and coordination of care.  Follow-Up Instructions: No follow-ups on file.   Bo Merino, MD  Note - This record has been created using Editor, commissioning.  Chart creation errors have been sought, but may not always  have been located. Such creation errors do not reflect on  the standard of medical care.

## 2019-05-19 ENCOUNTER — Ambulatory Visit (INDEPENDENT_AMBULATORY_CARE_PROVIDER_SITE_OTHER): Payer: Managed Care, Other (non HMO) | Admitting: Adult Health Nurse Practitioner

## 2019-05-19 ENCOUNTER — Encounter: Payer: Self-pay | Admitting: Adult Health Nurse Practitioner

## 2019-05-19 ENCOUNTER — Other Ambulatory Visit: Payer: Self-pay

## 2019-05-19 VITALS — BP 153/87 | Temp 98.6°F | Resp 16 | Ht 62.0 in | Wt 173.0 lb

## 2019-05-19 DIAGNOSIS — R300 Dysuria: Secondary | ICD-10-CM

## 2019-05-19 DIAGNOSIS — E559 Vitamin D deficiency, unspecified: Secondary | ICD-10-CM | POA: Diagnosis not present

## 2019-05-19 DIAGNOSIS — G479 Sleep disorder, unspecified: Secondary | ICD-10-CM | POA: Diagnosis not present

## 2019-05-19 DIAGNOSIS — M810 Age-related osteoporosis without current pathological fracture: Secondary | ICD-10-CM

## 2019-05-19 DIAGNOSIS — R5382 Chronic fatigue, unspecified: Secondary | ICD-10-CM

## 2019-05-19 DIAGNOSIS — R350 Frequency of micturition: Secondary | ICD-10-CM | POA: Diagnosis not present

## 2019-05-19 DIAGNOSIS — R5383 Other fatigue: Secondary | ICD-10-CM | POA: Insufficient documentation

## 2019-05-19 LAB — POCT URINALYSIS DIP (MANUAL ENTRY)
Bilirubin, UA: NEGATIVE
Glucose, UA: NEGATIVE mg/dL
Ketones, POC UA: NEGATIVE mg/dL
Nitrite, UA: NEGATIVE
Protein Ur, POC: NEGATIVE mg/dL
Spec Grav, UA: 1.015 (ref 1.010–1.025)
Urobilinogen, UA: 2 E.U./dL — AB
pH, UA: 6 (ref 5.0–8.0)

## 2019-05-19 MED ORDER — CIPROFLOXACIN HCL 500 MG PO TABS: 500.0000 mg | ORAL_TABLET | Freq: Two times a day (BID) | ORAL | 0 refills | Status: DC

## 2019-05-19 MED ORDER — TRAZODONE HCL 50 MG PO TABS: 25.0000 mg | ORAL_TABLET | Freq: Every evening | ORAL | 3 refills | Status: DC | PRN

## 2019-05-19 MED ORDER — ESCITALOPRAM OXALATE 20 MG PO TABS: 20.0000 mg | ORAL_TABLET | Freq: Every day | ORAL | 3 refills | Status: DC

## 2019-05-19 NOTE — Patient Instructions (Signed)
° ° ° °  If you have lab work done today you will be contacted with your lab results within the next 2 weeks.  If you have not heard from us then please contact us. The fastest way to get your results is to register for My Chart. ° ° °IF you received an x-ray today, you will receive an invoice from [facility]. Please contact [facility] at [phone] with questions or concerns regarding your invoice.  ° °IF you received labwork today, you will receive an invoice from [facility]. Please contact [facility] at [phone] with questions or concerns regarding your invoice.  ° °Our billing staff will not be able to assist you with questions regarding bills from these companies. ° °You will be contacted with the lab results as soon as they are available. The fastest way to get your results is to activate your My Chart account. Instructions are located on the last page of this paperwork. If you have not heard from us regarding the results in 2 weeks, please contact this office. °  ° ° ° °

## 2019-05-20 LAB — THYROID PANEL WITH TSH
Free Thyroxine Index: 1.8 (ref 1.2–4.9)
T3 Uptake Ratio: 22 % — ABNORMAL LOW (ref 24–39)
T4, Total: 8.2 ug/dL (ref 4.5–12.0)
TSH: 0.652 u[IU]/mL (ref 0.450–4.500)

## 2019-05-20 LAB — VITAMIN D 25 HYDROXY (VIT D DEFICIENCY, FRACTURES): Vit D, 25-Hydroxy: 28.7 ng/mL — ABNORMAL LOW (ref 30.0–100.0)

## 2019-05-20 LAB — LIPID PANEL
Chol/HDL Ratio: 2.4 ratio (ref 0.0–4.4)
Cholesterol, Total: 115 mg/dL (ref 100–199)
HDL: 48 mg/dL (ref >39)
LDL Chol Calc (NIH): 57 mg/dL (ref 0–99)
Triglycerides: 40 mg/dL (ref 0–149)
VLDL Cholesterol Cal: 10 mg/dL (ref 5–40)

## 2019-05-20 LAB — B12 AND FOLATE PANEL
Folate: >20 ng/mL (ref >3.0)
Vitamin B-12: 816 pg/mL (ref 232–1245)

## 2019-05-21 ENCOUNTER — Encounter: Payer: Self-pay | Admitting: Adult Health Nurse Practitioner

## 2019-05-21 DIAGNOSIS — R300 Dysuria: Secondary | ICD-10-CM

## 2019-05-21 HISTORY — DX: Dysuria: R30.0

## 2019-05-21 LAB — URINE CULTURE: Organism ID, Bacteria: NO GROWTH

## 2019-05-21 NOTE — Progress Notes (Signed)
Chief Complaint  Patient presents with  . Establish Care    pt has trouble sleeping at night, thur-sat.  . Urinary Retention    pt states that she feels like she is not urinating like she was   . Hot Flashes    pt states more frequent over the past 6 months    HPI   Patient presents with urinary frequency/dysuria over the past week.  Some discomfort with urination.  Feels that she is not completely emptying her bladder.  No blood in urine.  Some flank pain.  Denies fever or chills. In addition she has had increased hot flashes and is not sure if it is from the prednisone that she has been on long-term for her sarcoidosis or something else.  She had an acute episode of insomnias from Thursday through Saturday.  Stated she did not sleep at all.  Normally has no problem going to sleep.  No excessive stress.  No caffeine in the evenings.  Nothing to provoke episode of insomnia.  Normal sleep/wake cycle.  No excessive anxiety.  Hx of depression on small dose of Lexapro.  She does feel like it could be increased due to general life stress.   Review of Systems  Constitutional: Negative for activity change, appetite change, chills and fever.  HENT: Negative for congestion, nosebleeds, trouble swallowing and voice change.   Respiratory: Negative for cough, shortness of breath and wheezing.   Gastrointestinal: Negative for diarrhea, nausea and vomiting.  Genitourinary: Negative for difficulty urinating, dysuria, flank pain and hematuria.  Musculoskeletal: Negative for back pain, joint swelling and neck pain.  Neurological: Negative for dizziness, speech difficulty, light-headedness and numbness.  See HPI. All other review of systems negative.    OBJECTIVE: BP (!) 153/87   Temp 98.6 F (37 C) (Oral)   Resp 16   Ht 5\' 2"  (1.575 m)   Wt 173 lb (78.5 kg)   LMP 03/23/2019   SpO2 96%   BMI 31.64 kg/m    Constitutional:  alert, well appearing, and in no distress. Neck:  Supple.  Slightly  enlarged thyroid on the left.  No cervical adenopathy. CVS exam BP noted to be well controlled today in office, S1, S2 normal, no gallop, no murmur, chest clear, no JVD, no HSM, no edema. GU:  No CVA tenderness Skin:  Warm, dry.  No rash or lesion identified   Lab review: orders written for new lab studies as appropriate; see orders.      Medications      Current Outpatient Medications:  .  Calcium Carb-Cholecalciferol 600-800 MG-UNIT TABS, Take by mouth., Disp: , Rfl:  .  cetirizine (ZYRTEC) 10 MG tablet, Take 10 mg by mouth daily., Disp: , Rfl:  .  Cholecalciferol (VITAMIN D) 2000 UNITS CAPS, Take by mouth., Disp: , Rfl:  .  cyanocobalamin 500 MCG tablet, Take 500 mcg by mouth daily., Disp: , Rfl:  .  escitalopram (LEXAPRO) 20 MG tablet, Take 1 tablet (20 mg total) by mouth daily., Disp: 30 tablet, Rfl: 3 .  ferrous sulfate 324 MG TBEC, Take 324 mg by mouth daily with breakfast., Disp: , Rfl:  .  hydroxychloroquine (PLAQUENIL) 200 MG tablet, Take 1 tablet (200 mg total) by mouth 2 (two) times daily., Disp: 60 tablet, Rfl: 3 .  LARISSIA 0.1-20 MG-MCG tablet, , Disp: , Rfl:  .  predniSONE (DELTASONE) 1 MG tablet, Take 8 tablets (8 mg total) by mouth daily with breakfast., Disp: 240 tablet, Rfl: 2 .  ciprofloxacin (CIPRO) 500 MG tablet, Take 1 tablet (500 mg total) by mouth 2 (two) times daily., Disp: 6 tablet, Rfl: 0 .  traZODone (DESYREL) 50 MG tablet, Take 0.5-1 tablets (25-50 mg total) by mouth at bedtime as needed for sleep., Disp: 30 tablet, Rfl: 3  Problem List    Patient Active Problem List   Diagnosis Date Noted  . Dysuria 05/21/2019  . Osteoporosis   . Vitamin D deficiency   . Sleeping difficulty   . Fatigue   . Arthralgia 08/24/2015  . Bruising 08/24/2015  . 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 Mediastinal lymphadenopathy 11/22/2011  . Pulmonary infiltrates 11/22/2011  . Sarcoidosis ([facility]) 10/31/2011  . Heart palpitations 10/31/2011   POCT:  UA:   Trace blood, small leuks noted    Assessment & Plan:  Chevelle D Little is a 43 y.o. female .  1. Urinary frequency   2. Osteoporosis, unspecified osteoporosis type, unspecified pathological fracture presence   3. Vitamin D deficiency   4. Sleeping difficulty   5. Chronic fatigue   6. Dysuria      Lab Orders     Urine culture     Lipid panel     Vitamin D 25 (osteoporosis screening)     B12 and Folate Panel     Thyroid Panel With TSH     POCT urinalysis dipstick   Meds ordered this encounter  Medications  . ciprofloxacin (CIPRO) 500 MG tablet    Sig: Take 1 tablet (500 mg total) by mouth 2 (two) times daily.    Dispense:  6 tablet    Refill:  0  . traZODone (DESYREL) 50 MG tablet    Sig: Take 0.5-1 tablets (25-50 mg total) by mouth at bedtime as needed for sleep.    Dispense:  30 tablet    Refill:  3  . escitalopram (LEXAPRO) 20 MG tablet    Sig: Take 1 tablet (20 mg total) by mouth daily.    Dispense:  30 tablet    Refill:  3    Will treat for UTI.  Urine sent for culture.   Other labs sent for hot flashes, insomnia, and fatigue.  Will f/u once completed.     Patient Instructions       If you have lab work done today you will be contacted with your lab results within the next 2 weeks.  If you have not heard from [place] then please contact us. The fastest way to get your results is to register for My Chart.   IF you received an x-ray today, you will receive an invoice from [facility]. Please contact [facility] at [phone] with questions or concerns regarding your invoice.   IF you received labwork today, you will receive an invoice from [facility]. Please contact [facility] at [phone] with questions or concerns regarding your invoice.   Our billing staff will not be able to assist you with questions regarding bills from these companies.  You will be contacted with the lab results as soon as they are available. The fastest way to get  your results is to activate your My Chart account. Instructions are located on the last page of this paperwork. If you have not heard from [place] regarding the results in 2 weeks, please contact this office.          Elyse Jarvis, NP

## 2019-05-25 ENCOUNTER — Ambulatory Visit: Payer: Managed Care, Other (non HMO) | Admitting: Rheumatology

## 2019-05-27 ENCOUNTER — Telehealth: Payer: Self-pay | Admitting: Adult Health Nurse Practitioner

## 2019-05-27 DIAGNOSIS — M810 Age-related osteoporosis without current pathological fracture: Secondary | ICD-10-CM

## 2019-05-27 DIAGNOSIS — E559 Vitamin D deficiency, unspecified: Secondary | ICD-10-CM

## 2019-05-27 DIAGNOSIS — R5382 Chronic fatigue, unspecified: Secondary | ICD-10-CM

## 2019-05-27 DIAGNOSIS — R350 Frequency of micturition: Secondary | ICD-10-CM

## 2019-05-27 DIAGNOSIS — M8580 Other specified disorders of bone density and structure, unspecified site: Secondary | ICD-10-CM

## 2019-05-27 NOTE — Telephone Encounter (Signed)
Pt would like to discuss her lab results   Please call back

## 2019-05-28 DIAGNOSIS — M8580 Other specified disorders of bone density and structure, unspecified site: Secondary | ICD-10-CM | POA: Insufficient documentation

## 2019-06-23 ENCOUNTER — Telehealth: Payer: Self-pay | Admitting: Adult Health Nurse Practitioner

## 2019-06-23 NOTE — Telephone Encounter (Signed)
Medication Refill - Medication: Cholecalciferol (VITAMIN D) 2000 UNITS CAPS   Pt has also been diagnosed with covid-19 and is asking for an inhaler to help with her breathing / please advise   Has the patient contacted their pharmacy? No. (Agent: If no, request that the patient contact the pharmacy for the refill.) (Agent: If yes, when and what did the pharmacy advise?)  Preferred Pharmacy (with phone number or street name):  [id number] [address], [place] Phone:  [phone]  Fax:  [phone]       Agent: Please be advised that RX refills may take up to 3 business days. We ask that you follow-up with your pharmacy.

## 2019-06-23 NOTE — Telephone Encounter (Signed)
Please advise if inhaler can be sent into the pharmacy ans patient also would like a refill on her vit d 2000 unit cap

## 2019-06-24 ENCOUNTER — Telehealth: Payer: Self-pay | Admitting: Infectious Diseases

## 2019-06-24 ENCOUNTER — Other Ambulatory Visit: Payer: Self-pay | Admitting: Infectious Diseases

## 2019-06-24 DIAGNOSIS — U071 COVID-19: Secondary | ICD-10-CM

## 2019-06-24 NOTE — Telephone Encounter (Signed)
Called to discuss with patient about Covid symptoms and the use of bamlanivimab, a monoclonal antibody infusion for those with mild to moderate Covid symptoms and at a high risk of hospitalization.  Pt is qualified for this infusion at the [facility] infusion center due to immuno-compromised   Started showing symptoms 06/22/19 with (+) result 12/28. She feels a little worse today. She has more body aches and nasal congestion and diarrhea that started today. She takes immune suppressing medication to manage chronic health conditions.   Will schedule for Monday 1/4 @ 2:30 p.m.

## 2019-06-24 NOTE — Telephone Encounter (Signed)
Pt's daughter called to verify prescription needs to be sent to the [address] [facility]. Also to request an update for the prescriptions because she went to the pharmacy and they do not have the meds.  Pt's daughter also included the pt is covid +

## 2019-06-24 NOTE — Progress Notes (Signed)
  I connected by phone with Mindy Brown on 06/24/2019 at 10:38 AM to discuss the potential use of an new treatment for mild to moderate COVID-19 viral infection in non-hospitalized patients.  This patient is a 43 y.o. female that meets the FDA criteria for Emergency Use Authorization of bamlanivimab or casirivimab\imdevimab.  Has a (+) direct SARS-CoV-2 viral test result  Has mild or moderate COVID-19   Is ? 41 years of age and weighs ? 40 kg  Is NOT hospitalized due to COVID-19  Is NOT requiring oxygen therapy or requiring an increase in baseline oxygen flow rate due to COVID-19  Is within 10 days of symptom onset  Has at least one of the high risk factor(s) for progression to severe COVID-19 and/or hospitalization as defined in EUA.  Specific high risk criteria : Currently receiving immunosuppressive treatment   I have spoken and communicated the following to the patient or parent/caregiver:  1. FDA has authorized the emergency use of bamlanivimab and casirivimab\imdevimab for the treatment of mild to moderate COVID-19 in adults and pediatric patients with positive results of direct SARS-CoV-2 viral testing who are 12 years of age and older weighing at least 40 kg, and who are at high risk for progressing to severe COVID-19 and/or hospitalization.  2. The significant known and potential risks and benefits of bamlanivimab and casirivimab\imdevimab, and the extent to which such potential risks and benefits are unknown.  3. Information on available alternative treatments and the risks and benefits of those alternatives, including clinical trials.  4. Patients treated with bamlanivimab and casirivimab\imdevimab should continue to self-isolate and use infection control measures (e.g., wear mask, isolate, social distance, avoid sharing personal items, clean and disinfect "high touch" surfaces, and frequent handwashing) according to CDC guidelines.   5. The patient or parent/caregiver  has the option to accept or refuse bamlanivimab or casirivimab\imdevimab .  After reviewing this information with the patient, The patient agreed to proceed with receiving the bamlanimivab infusion and will be provided a copy of the Fact sheet prior to receiving the infusion.Mindy Brown 06/24/2019 10:38 AM

## 2019-06-28 ENCOUNTER — Ambulatory Visit (HOSPITAL_COMMUNITY)
Admission: RE | Admit: 2019-06-28 | Discharge: 2019-06-28 | Disposition: A | Discharge status: Home / Self Care | Payer: Managed Care, Other (non HMO) | Source: Ambulatory Visit | Attending: Pulmonary Disease | Admitting: Pulmonary Disease

## 2019-06-28 DIAGNOSIS — Z23 Encounter for immunization: Secondary | ICD-10-CM | POA: Insufficient documentation

## 2019-06-28 DIAGNOSIS — U071 COVID-19: Secondary | ICD-10-CM | POA: Diagnosis present

## 2019-06-28 MED ORDER — METHYLPREDNISOLONE SODIUM SUCC 125 MG IJ SOLR: 125.0000 mg | Freq: Once | INTRAMUSCULAR | Status: DC | PRN

## 2019-06-28 MED ORDER — DIPHENHYDRAMINE HCL 50 MG/ML IJ SOLN: 50.0000 mg | Freq: Once | INTRAMUSCULAR | Status: DC | PRN

## 2019-06-28 MED ORDER — FAMOTIDINE IN NACL 20-0.9 MG/50ML-% IV SOLN: 20.0000 mg | Freq: Once | INTRAVENOUS | Status: DC | PRN

## 2019-06-28 MED ORDER — SODIUM CHLORIDE 0.9 % IV SOLN
INTRAVENOUS | Status: DC | PRN
  Administered 2019-06-28: 15:00:00 250 mL via INTRAVENOUS

## 2019-06-28 MED ORDER — SODIUM CHLORIDE 0.9 % IV SOLN
700.0000 mg | Freq: Once | INTRAVENOUS | Status: AC
  Administered 2019-06-28: 700 mg via INTRAVENOUS
  Filled 2019-06-28: qty 20

## 2019-06-28 MED ORDER — ALBUTEROL SULFATE HFA 108 (90 BASE) MCG/ACT IN AERS: 2.0000 | INHALATION_SPRAY | Freq: Once | RESPIRATORY_TRACT | Status: DC | PRN

## 2019-06-28 MED ORDER — EPINEPHRINE 0.3 MG/0.3ML IJ SOAJ: 0.3000 mg | Freq: Once | INTRAMUSCULAR | Status: DC | PRN

## 2019-06-28 NOTE — Progress Notes (Signed)
.   Diagnosis: COVID-19  Physician: Dr. Wright   Procedure: [facility] Med: bamlanivimab infusion - Provided patient with bamlanimivab fact sheet for patients, parents and caregivers prior to infusion.  Complications: No immediate complications noted.  Discharge: Discharged home   Mindy Brown A 06/28/2019  

## 2019-06-28 NOTE — Discharge Instructions (Signed)

## 2019-06-29 ENCOUNTER — Other Ambulatory Visit: Payer: Self-pay | Admitting: Adult Health Nurse Practitioner

## 2019-06-29 MED ORDER — VITAMIN D 50 MCG (2000 UT) PO CAPS: 1.0000 | ORAL_CAPSULE | Freq: Every day | ORAL | 3 refills | Status: AC

## 2019-06-29 MED ORDER — TRAZODONE HCL 50 MG PO TABS: 25.0000 mg | ORAL_TABLET | Freq: Every evening | ORAL | 3 refills | Status: DC | PRN

## 2019-06-29 NOTE — Telephone Encounter (Signed)
Resent to patient's pharmacy on [facility]

## 2019-07-15 ENCOUNTER — Ambulatory Visit: Payer: Managed Care, Other (non HMO) | Admitting: Internal Medicine

## 2019-07-28 ENCOUNTER — Other Ambulatory Visit: Payer: Self-pay | Admitting: Internal Medicine

## 2019-08-17 ENCOUNTER — Other Ambulatory Visit: Payer: Self-pay | Admitting: Internal Medicine

## 2019-08-17 DIAGNOSIS — D860 Sarcoidosis of lung: Secondary | ICD-10-CM

## 2019-08-17 DIAGNOSIS — D869 Sarcoidosis, unspecified: Secondary | ICD-10-CM

## 2019-08-17 DIAGNOSIS — D863 Sarcoidosis of skin: Secondary | ICD-10-CM

## 2019-08-25 ENCOUNTER — Ambulatory Visit
Admission: RE | Admit: 2019-08-25 | Discharge: 2019-08-25 | Disposition: A | Discharge status: Home / Self Care | Payer: Managed Care, Other (non HMO) | Source: Ambulatory Visit | Attending: Adult Health Nurse Practitioner | Admitting: Adult Health Nurse Practitioner

## 2019-08-25 ENCOUNTER — Other Ambulatory Visit: Payer: Self-pay

## 2019-08-25 DIAGNOSIS — E559 Vitamin D deficiency, unspecified: Secondary | ICD-10-CM

## 2019-08-25 DIAGNOSIS — T380X5A Adverse effect of glucocorticoids and synthetic analogues, initial encounter: Secondary | ICD-10-CM

## 2019-08-25 DIAGNOSIS — M8580 Other specified disorders of bone density and structure, unspecified site: Secondary | ICD-10-CM

## 2019-09-16 ENCOUNTER — Telehealth: Payer: Self-pay

## 2019-09-16 NOTE — Telephone Encounter (Signed)
Called patient to schedule appointment . Lvm for them to call back and schedule

## 2019-09-16 NOTE — Telephone Encounter (Signed)
Pt is having bp concerns, sending message to scheduling pool for appt and lab

## 2019-09-17 ENCOUNTER — Other Ambulatory Visit: Payer: Self-pay

## 2019-09-17 ENCOUNTER — Telehealth (INDEPENDENT_AMBULATORY_CARE_PROVIDER_SITE_OTHER): Payer: Managed Care, Other (non HMO) | Admitting: Adult Health Nurse Practitioner

## 2019-09-17 DIAGNOSIS — I10 Essential (primary) hypertension: Secondary | ICD-10-CM

## 2019-09-17 NOTE — Progress Notes (Signed)
Virtual Visit via Video Note  I connected with Maniya D Little on 09/17/19 at  1:50 PM EDT by a video enabled telemedicine application and verified that I am speaking with the correct person using two identifiers.  Location: Patient: home Provider: [facility]   I discussed the limitations of evaluation and management by telemedicine and the availability of in person appointments. The patient expressed understanding and agreed to proceed.  History of Present Illness: Pt has been having HA due to elevated BP. The pain was a constant 7 out of 10 pain level  Today did not wake up with a HA.  For the past 3 days   These have been the Home reading for the last 3 days. One was not taken today.  146/112 136/101 133/88  Used BP medication and got BP under control and then stop per doctor recommendation at that time.    Observations/Objective: alert, well appearing, and in no distressGeneral appearance: alert, well appearing, and in no distress.  Assessment and Plan: Hypertension.  Follow Up Instructions:  I have asked the patient to follow up in the office for further evaluation.  She does have a complicated hx of sarcoidosis.  Continue BP monitoring and go to ER if BP or headache increases.   She is inline with this plan.   I discussed the assessment and treatment plan with the patient. The patient was provided an opportunity to ask questions and all were answered. The patient agreed with the plan and demonstrated an understanding of the instructions.   The patient was advised to call back or seek an in-person evaluation if the symptoms worsen or if the condition fails to improve as anticipated.  I provided 10 minutes of non-face-to-face time during this encounter.   Elyse Jarvis, NP

## 2019-09-17 NOTE — Progress Notes (Signed)
Pt has been having HA due to elevated BP. The pain was a constant 7 out of 10 pain level  Today did not wake up with a HA.  For the past 3 days   These have been the Home reading for the last 3 days. One was not taken today.  146/112 136/101 133/88  Used BP medication and got BP under control and then stop per doctor recommendation at that time.

## 2019-09-17 NOTE — Patient Instructions (Signed)
° ° ° °  If you have lab work done today you will be contacted with your lab results within the next 2 weeks.  If you have not heard from us then please contact us. The fastest way to get your results is to register for My Chart. ° ° °IF you received an x-ray today, you will receive an invoice from [facility]. Please contact [facility] at [phone] with questions or concerns regarding your invoice.  ° °IF you received labwork today, you will receive an invoice from [facility]. Please contact [facility] at [phone] with questions or concerns regarding your invoice.  ° °Our billing staff will not be able to assist you with questions regarding bills from these companies. ° °You will be contacted with the lab results as soon as they are available. The fastest way to get your results is to activate your My Chart account. Instructions are located on the last page of this paperwork. If you have not heard from us regarding the results in 2 weeks, please contact this office. °  ° ° ° °

## 2019-09-28 ENCOUNTER — Ambulatory Visit: Payer: Managed Care, Other (non HMO) | Admitting: Adult Health Nurse Practitioner

## 2019-09-28 ENCOUNTER — Encounter: Payer: Self-pay | Admitting: Adult Health Nurse Practitioner

## 2019-09-28 ENCOUNTER — Other Ambulatory Visit: Payer: Self-pay

## 2019-09-28 VITALS — BP 126/81 | HR 77 | Temp 98.0°F | Resp 16 | Ht 61.75 in | Wt 174.6 lb

## 2019-09-28 DIAGNOSIS — R0989 Other specified symptoms and signs involving the circulatory and respiratory systems: Secondary | ICD-10-CM

## 2019-09-28 DIAGNOSIS — G5793 Unspecified mononeuropathy of bilateral lower limbs: Secondary | ICD-10-CM

## 2019-09-28 MED ORDER — GABAPENTIN 100 MG PO CAPS: 100.0000 mg | ORAL_CAPSULE | Freq: Three times a day (TID) | ORAL | 3 refills | Status: DC

## 2019-09-28 NOTE — Patient Instructions (Signed)
° ° ° °  If you have lab work done today you will be contacted with your lab results within the next 2 weeks.  If you have not heard from us then please contact us. The fastest way to get your results is to register for My Chart. ° ° °IF you received an x-ray today, you will receive an invoice from [facility]. Please contact [facility] at [phone] with questions or concerns regarding your invoice.  ° °IF you received labwork today, you will receive an invoice from [facility]. Please contact [facility] at [phone] with questions or concerns regarding your invoice.  ° °Our billing staff will not be able to assist you with questions regarding bills from these companies. ° °You will be contacted with the lab results as soon as they are available. The fastest way to get your results is to activate your My Chart account. Instructions are located on the last page of this paperwork. If you have not heard from us regarding the results in 2 weeks, please contact this office. °  ° ° ° °

## 2019-10-05 NOTE — Progress Notes (Unsigned)
Chief Complaint  Patient presents with  . Hypertension    f/u from video visit on 3/26, per pt went to urgent care last week for elevated bp's, then bp's decreased f    or a few days.  Pt c/o some edema in feet.  Per pt  BUN, creatinine and A1C checked and were all fine, but pt is still concerned  . fosamax rx needed that was not called in at last visit, and     HPI   Mindy Brown  Presents with increased, labile Bps that have gone from the upper limites of 100 systolic over 80s to 90s diastolic.  This was accompanied with a change in her neuropathic symptoms with the typical feet neuropathy bilaterally.  Then, running mid thigh down to lower extremity as well as patchy areas in arms bilaterally.  This has increased in intensity over the past 2 weeks.    Subjective weakness to upper extremities R?L.  Hx of sarcoid maintained on Prednisone currently.  Bone density showed osteopenia  likely due to chronic steroid use.  We had decided to start Fosamax but new neuropathy needs to be evaluated first.   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  Medications     Current Meds  Medication Sig  . Calcium Carb-Cholecalciferol 600-800 MG-UNIT TABS Take by mouth.  . cetirizine (ZYRTEC) 10 MG tablet Take 10 mg by mouth daily.  . cyanocobalamin 500 MCG tablet Take 500 mcg by mouth daily.  . hydroxychloroquine (PLAQUENIL) 200 MG tablet TAKE 1 TABLET BY MOUTH TWICE A DAY  . LARISSIA 0.1-20 MG-MCG tablet   . predniSONE (DELTASONE) 1 MG tablet TAKE 8 TABLETS  (8 MG TOTAL) BY MOUTH DAILY WITH BREAKFAST.  Marland Kitchen traZODone (DESYREL) 50 MG tablet Take 0.5-1 tablets (25-50 mg total) by mouth at bedtime as needed for sleep.     Review of Systems    Constitutional: Negative for activity change, appetite change, chills and fever.  HENT: Negative for congestion, nosebleeds, trouble swallowing and voice change.   Respiratory: Negative for cough, shortness of breath and wheezing.   Cardiac:  Negative for chest pain, pressure, syncope  Gastrointestinal: Negative for diarrhea, nausea and vomiting.  Genitourinary: Negative for difficulty urinating, dysuria, flank pain and hematuria.  Musculoskeletal: Negative for back pain, joint swelling and neck pain.  Neurological: positive for increased burning/pain, neuropathic symptoms to arms and lower extremities. .  See HPI. All other review of systems negative.     Physical Exam:    height is 5' 1.75" (1.568 m) and weight is 174 lb 9.6 oz (79.2 kg). Her temporal temperature is 98 F (36.7 C). Her blood pressure is 126/81 and her pulse is 77. Her respiration is 16 and oxygen saturation is 95%.   Physical Examination: General appearance - alert, well appearing, and in no distress and oriented to person, place, and time Mental status - normal mood, behavior, speech, dress, motor activity, and thought processes Eyes - PERRL. Extraocular movements intact.  No nystagmus.  Neck - supple, no significant adenopathy, carotids upstroke normal bilaterally, no bruits, thyroid exam: thyroid is normal in size without nodules or tenderness Chest - clear to auscultation, no wheezes, rales or rhonchi,  symmetric air entry  Heart - normal rate, regular rhythm, normal S1, S2, no murmurs, rubs, clicks or gallops Extremities - dependent LE edema without clubbing or cyanosis Neurological:  .Neurological exam reveals alert, oriented, normal speech, no focal findings or movement disorder noted, DTR's normal and symmetric, normal muscle tone,  no tremors, strength 5/5, with the exception of hyperrefleixa to upper extremities:  Wrist Extension 5-/5 right>left.  Gait, including tandem is normal. .  Skin - normal coloration and turgor, no rashes, no suspicious skin lesions noted  No hyperpigmentation of skin.  No current hematomas noted   Lab /Imaging Review    Labs up to date.  Assessment & Plan:  Mindy Brown is a 44 y.o. female    1. Neuropathy involving both lower extremities   2. Labile hypertension    No orders of the defined types were placed in this encounter.  Meds ordered this encounter  Medications  . gabapentin (NEURONTIN) 100 MG capsule    Sig: Take 1 capsule (100 mg total) by mouth 3 (three) times daily.    Dispense:  90 capsule    Refill:  3   Blood pressure was normalized today.  Will have patient check pressures at home and retjrn  In 1-2 weeks after neuro evaluation to understand why she is having paresthesias, pain, and neuropathy develop beyond stocking/glove.  She isi inline with this plan. Refer to neuro asap  A total of  40 inutes were spent face-to-face with the patient during this encounter and over half of that time was spent on counseling and coordination of care.  Glyn Ade, NP

## 2019-10-06 ENCOUNTER — Ambulatory Visit (INDEPENDENT_AMBULATORY_CARE_PROVIDER_SITE_OTHER): Payer: Managed Care, Other (non HMO) | Admitting: Diagnostic Neuroimaging

## 2019-10-06 ENCOUNTER — Other Ambulatory Visit: Payer: Self-pay

## 2019-10-06 ENCOUNTER — Encounter: Payer: Self-pay | Admitting: Diagnostic Neuroimaging

## 2019-10-06 ENCOUNTER — Telehealth: Payer: Self-pay | Admitting: Diagnostic Neuroimaging

## 2019-10-06 VITALS — BP 122/80 | HR 76 | Temp 97.5°F | Ht 61.25 in | Wt 176.2 lb

## 2019-10-06 DIAGNOSIS — R200 Anesthesia of skin: Secondary | ICD-10-CM

## 2019-10-06 DIAGNOSIS — R292 Abnormal reflex: Secondary | ICD-10-CM

## 2019-10-06 DIAGNOSIS — D869 Sarcoidosis, unspecified: Secondary | ICD-10-CM | POA: Diagnosis not present

## 2019-10-06 NOTE — Progress Notes (Signed)
[facility]  PATIENT: Mindy Brown DOB: [birth year]  REFERRING CLINICIAN: Wendall Mola, NP HISTORY FROM: patient  REASON FOR VISIT: new consult    HISTORICAL  CHIEF COMPLAINT:  Chief Complaint  Patient presents with  . Neuropathy in BLE    rm 6 New Pt "numbness/tingling/burning for a long time due to sarcoidosis; getting worse and going up my legs with swelling"    HISTORY OF PRESENT ILLNESS:   44 year old female here for evaluation of numbness and tingling.  History of sarcoidosis affecting the lungs, skin and joints.  Age 84 years old patient had onset of bilateral arm weakness, lower extremity weakness.  She was hospitalized for several weeks, was not able to walk, but no specific cause was found.  She recovered over several weeks after being discharged home.  In 2013 patient had progressive skin problems, breathing problems, joint problems, ultimately diagnosed with sarcoidosis via lymph node biopsy.  She was started on prednisone and Plaquenil.  She was slightly improved but symptoms continue to progress later on.  Consideration for methotrexate was offered but patient held off due to concern of possible side effects.  Around 2013 she also had some numbness and tingling in glove and stocking distribution, was referred to neurology but did not end up getting a consult.  Past few months numbness and tingling has increased from her feet, ankles up to her lower legs up to her knees.  Also noticing some numbness in her left forearm.  Symptoms are getting to be painful.  She has been restarting gabapentin 200 mg at bedtime without relief.     REVIEW OF SYSTEMS: Full 14 system review of systems performed and negative with exception of: As per HPI.  ALLERGIES: No Known Allergies  HOME MEDICATIONS: Outpatient Medications Prior to Visit  Medication Sig Dispense Refill  . Calcium Carb-Cholecalciferol 600-800 MG-UNIT TABS Take by mouth.    .  cetirizine (ZYRTEC) 10 MG tablet Take 10 mg by mouth daily.    . [employer] D3 50 MCG (2000 UT) CAPS Take 1 capsule by mouth daily.    . cyanocobalamin 500 MCG tablet Take 500 mcg by mouth daily.    Marland Kitchen escitalopram (LEXAPRO) 20 MG tablet Take 1 tablet (20 mg total) by mouth daily. 30 tablet 3  . gabapentin (NEURONTIN) 100 MG capsule Take 1 capsule (100 mg total) by mouth 3 (three) times daily. 90 capsule 3  . hydroxychloroquine (PLAQUENIL) 200 MG tablet TAKE 1 TABLET BY MOUTH TWICE A DAY 60 tablet 0  . LARISSIA 0.1-20 MG-MCG tablet     . predniSONE (DELTASONE) 1 MG tablet TAKE 8 TABLETS (8 MG TOTAL) BY MOUTH DAILY WITH BREAKFAST. 240 tablet 2  . traZODone (DESYREL) 50 MG tablet Take 0.5-1 tablets (25-50 mg total) by mouth at bedtime as needed for sleep. 30 tablet 3  . ferrous sulfate 324 MG TBEC Take 324 mg by mouth daily with breakfast.     No facility-administered medications prior to visit.    PAST MEDICAL HISTORY: Past Medical History:  Diagnosis Date  . Allergy   . Anemia    takes Hemocyte plus  . Asthma    doesn't require any meds;dx in 2003  . Depression    was on celexa for a short period of time;has been off x 19yr  . Dysuria 05/21/2019  . Fatigue   . GERD (gastroesophageal reflux disease)    doesn't require any meds at present  . Headache(784.0)   . Heart murmur  just found out about this in 04/2011  . Joint pain   . Joint swelling   . Nocturia   . Osteoporosis   . Pneumonia    hx of;in 1999  . Polyarthralgia   . Rash    on face  . Sarcoidosis 2013  . Seasonal allergies   . Shortness of breath    sitting/lying/standing  . Sleeping difficulty   . Urticaria   . Vitamin D deficiency     PAST SURGICAL HISTORY: Past Surgical History:  Procedure Laterality Date  . CESAREAN SECTION  1995  . ESOPHAGOGASTRODUODENOSCOPY    . LUNG BIOPSY      FAMILY HISTORY: Family History  Problem Relation Age of Onset  . Breast cancer Mother   . Squamous cell carcinoma Mother     . High blood pressure Father   . High blood pressure Paternal Aunt   . High blood pressure Paternal Uncle   . Heart disease Paternal Grandmother   . Leukemia Paternal Grandmother   . High blood pressure Paternal Aunt   . Atrial fibrillation Brother   . Congestive Heart Failure Brother   . Anesthesia problems Neg Hx   . Hypotension Neg Hx   . Malignant hyperthermia Neg Hx   . Pseudochol deficiency Neg Hx     SOCIAL HISTORY: Social History   Socioeconomic History  . Marital status: Legally Separated    Spouse name: Not on file  . Number of children: 2  . Years of education: Not on file  . Highest education level: Bachelor's degree (e.g., BA, AB, BS)  Occupational History    Comment: [employer]  Tobacco Use  . Smoking status: Never Smoker  . Smokeless tobacco: Never Used  Substance and Sexual Activity  . Alcohol use: Yes    Comment: occasionally  . Drug use: No  . Sexual activity: Yes    Birth control/protection: Pill  Other Topics Concern  . Not on file  Social History Narrative   Lives alone   Social Determinants of Health   Financial Resource Strain:   . Difficulty of Paying Living Expenses:   Food Insecurity:   . Worried About Charity fundraiser in the Last Year:   . Arboriculturist in the Last Year:   Transportation Needs:   . Film/video editor (Medical):   Marland Kitchen Lack of Transportation (Non-Medical):   Physical Activity:   . Days of Exercise per Week:   . Minutes of Exercise per Session:   Stress:   . Feeling of Stress :   Social Connections:   . Frequency of Communication with Friends and Family:   . Frequency of Social Gatherings with Friends and Family:   . Attends Religious Services:   . Active Member of Clubs or Organizations:   . Attends Archivist Meetings:   Marland Kitchen Marital Status:   Intimate Partner Violence:   . Fear of Current or Ex-Partner:   . Emotionally Abused:   Marland Kitchen Physically Abused:   . Sexually Abused:       PHYSICAL EXAM  GENERAL EXAM/CONSTITUTIONAL: Vitals:  Vitals:   10/06/19 0850  BP: 122/80  Pulse: 76  Temp: (!) 97.5 F (36.4 C)  Weight: 176 lb 3.2 oz (79.9 kg)  Height: 5' 1.25" (1.556 m)     Body mass index is 33.02 kg/m. Wt Readings from Last 3 Encounters:  10/06/19 176 lb 3.2 oz (79.9 kg)  09/28/19 174 lb 9.6 oz (79.2 kg)  05/19/19  173 lb (78.5 kg)     Patient is in no distress; well developed, nourished and groomed; neck is supple  CARDIOVASCULAR:  Examination of carotid arteries is normal; no carotid bruits  Regular rate and rhythm, no murmurs  Examination of peripheral vascular system by observation and palpation is normal  EYES:  Ophthalmoscopic exam of optic discs and posterior segments is normal; no papilledema or hemorrhages  No exam data present  MUSCULOSKELETAL:  Gait, strength, tone, movements noted in Neurologic exam below  NEUROLOGIC: MENTAL STATUS:  No flowsheet data found.  awake, alert, oriented to person, place and time  recent and remote memory intact  normal attention and concentration  language fluent, comprehension intact, naming intact  fund of knowledge appropriate  CRANIAL NERVE:   2nd - no papilledema on fundoscopic exam  2nd, 3rd, 4th, 6th - pupils equal and reactive to light, visual fields full to confrontation, extraocular muscles intact, no nystagmus  5th - facial sensation symmetric  7th - facial strength symmetric  8th - hearing intact  9th - palate elevates symmetrically, uvula midline  11th - shoulder shrug symmetric  12th - tongue protrusion midline  MOTOR:   normal bulk and tone, full strength in the BUE, BLE  SENSORY:   normal and symmetric to light touch, pinprick, temperature, vibration; EXCEPT DECR IN FEET / ANKLES TO PP  COORDINATION:   finger-nose-finger, fine finger movements normal  REFLEXES:   deep tendon reflexes BRISK IN ARMS AND KNEES; 1+ AT ANKLES  GAIT/STATION:    narrow based gait     DIAGNOSTIC DATA (LABS, IMAGING, TESTING) - I reviewed patient records, labs, notes, testing and imaging myself where available.  Lab Results  Component Value Date   WBC 10.9 (H) 04/28/2019   HGB 11.7 04/28/2019   HCT 36.4 04/28/2019   MCV 91.5 04/28/2019   PLT 334 04/28/2019      Component Value Date/Time   NA 140 04/28/2019 1058   K 3.5 04/28/2019 1058   CL 103 04/28/2019 1058   CO2 23 04/28/2019 1058   GLUCOSE 89 04/28/2019 1058   BUN 10 04/28/2019 1058   CREATININE 0.73 04/28/2019 1058   CALCIUM 9.3 04/28/2019 1058   PROT 7.7 04/28/2019 1058   PROT 7.6 04/28/2019 1058   ALBUMIN 3.3 (L) 11/20/2011 1626   AST 19 04/28/2019 1058   ALT 14 04/28/2019 1058   ALKPHOS 43 11/20/2011 1626   BILITOT 0.6 04/28/2019 1058   GFRNONAA 102 04/28/2019 1058   GFRAA 119 04/28/2019 1058   Lab Results  Component Value Date   CHOL 115 05/19/2019   HDL 48 05/19/2019   LDLCALC 57 05/19/2019   TRIG 40 05/19/2019   CHOLHDL 2.4 05/19/2019   Lab Results  Component Value Date   HGBA1C 5.3 05/25/2015   Lab Results  Component Value Date   VITAMINB12 816 05/19/2019   Lab Results  Component Value Date   TSH 0.652 05/19/2019       ASSESSMENT AND PLAN  44 y.o. year old female here with history of sarcoidosis (pulmonary, skin and joint) on Plaquenil and prednisone, with numbness and tingling in the feet and legs since 2013, worsening in the last few months.  Dx:  1. Numbness   2. Hyperreflexia   3. Sarcoidosis     PLAN:  NUMBNESS IN FEET / ANKLES (? neuropathy from sarcoid) - check neuropathy labs (rule out other causes) - check EMG/NCS - continue gabapentin (may gradually increase up to 300-630m twice a day)  NUMBNESS IN LEFT ARM / HYPERREFLEXIA (eval for neurosarcoidosis) - check MRI brain, cervical, thoracic spine w/wo (neurosarcoidosis evaluation)  Orders Placed This Encounter  Procedures  . MR BRAIN W WO CONTRAST  . MR CERVICAL SPINE W  WO CONTRAST  . MR THORACIC SPINE W WO CONTRAST  . Multiple Myeloma Panel (SPEP&IFE w/QIG)  . Hemoglobin A1c  . NCV with EMG(electromyography)   Return for for NCV/EMG.    Penni Bombard, MD 4/38/8875, 7:97 AM Certified in Neurology, Neurophysiology and Neuroimaging  [facility] [address] [place], [place] [address] [phone]

## 2019-10-06 NOTE — Telephone Encounter (Signed)
[employer] order sent to GI. They will obtain the auth and reach out to the patient to schedule.  

## 2019-10-12 LAB — MULTIPLE MYELOMA PANEL, SERUM
Albumin SerPl Elph-Mcnc: 3.7 g/dL (ref 2.9–4.4)
Albumin/Glob SerPl: 1.3 (ref 0.7–1.7)
Alpha 1: 0.2 g/dL (ref 0.0–0.4)
Alpha2 Glob SerPl Elph-Mcnc: 0.5 g/dL (ref 0.4–1.0)
B-Globulin SerPl Elph-Mcnc: 1 g/dL (ref 0.7–1.3)
Gamma Glob SerPl Elph-Mcnc: 1.3 g/dL (ref 0.4–1.8)
Globulin, Total: 3 g/dL (ref 2.2–3.9)
IgA/Immunoglobulin A, Serum: 210 mg/dL (ref 87–352)
IgG (Immunoglobin G), Serum: 1340 mg/dL (ref 586–1602)
IgM (Immunoglobulin M), Srm: 157 mg/dL (ref 26–217)
Total Protein: 6.7 g/dL (ref 6.0–8.5)

## 2019-10-12 LAB — HEMOGLOBIN A1C
Est. average glucose Bld gHb Est-mCnc: 94 mg/dL
Hgb A1c MFr Bld: 4.9 % (ref 4.8–5.6)

## 2019-10-18 ENCOUNTER — Encounter: Payer: Self-pay | Admitting: *Deleted

## 2019-11-03 ENCOUNTER — Other Ambulatory Visit: Payer: Managed Care, Other (non HMO)

## 2019-11-04 ENCOUNTER — Encounter (INDEPENDENT_AMBULATORY_CARE_PROVIDER_SITE_OTHER): Payer: Managed Care, Other (non HMO) | Admitting: Diagnostic Neuroimaging

## 2019-11-04 ENCOUNTER — Ambulatory Visit (INDEPENDENT_AMBULATORY_CARE_PROVIDER_SITE_OTHER): Payer: Managed Care, Other (non HMO) | Admitting: Diagnostic Neuroimaging

## 2019-11-04 ENCOUNTER — Other Ambulatory Visit: Payer: Self-pay

## 2019-11-04 DIAGNOSIS — R292 Abnormal reflex: Secondary | ICD-10-CM

## 2019-11-04 DIAGNOSIS — R200 Anesthesia of skin: Secondary | ICD-10-CM | POA: Diagnosis not present

## 2019-11-04 DIAGNOSIS — D869 Sarcoidosis, unspecified: Secondary | ICD-10-CM

## 2019-11-04 DIAGNOSIS — Z0289 Encounter for other administrative examinations: Secondary | ICD-10-CM

## 2019-11-05 NOTE — Procedures (Signed)
[facility]  NCS (NERVE CONDUCTION STUDY) WITH EMG (ELECTROMYOGRAPHY) REPORT   STUDY DATE: 11/04/19 PATIENT NAME: Mindy Brown DOB: Dec 29, 1975 MRN: [id number]  ORDERING CLINICIAN: Andrey Spearman, MD   TECHNOLOGIST: Sherre Scarlet ELECTROMYOGRAPHER: Earlean Polka. Stephanee Barcomb, MD  CLINICAL INFORMATION: 44 year old female with numbness.  FINDINGS: NERVE CONDUCTION STUDY: Bilateral peroneal and tibial motor responses are normal.  Bilateral sural and superficial peroneal sensory responses are normal.  Bilateral tibial F wave latencies are normal.  NEEDLE ELECTROMYOGRAPHY:  Needle examination of left lower extremity is normal.   IMPRESSION:   Normal study.  No electrodiagnostic evidence of large fiber neuropathy.    INTERPRETING PHYSICIAN:  Penni Bombard, MD Certified in Neurology, Neurophysiology and Neuroimaging  [facility] [address] [place], [place] [address] [phone]   [facility]    Nerve / Sites Muscle Latency Ref. Amplitude Ref. Rel Amp Segments Distance Velocity Ref. Area    ms ms mV mV %  cm m/s m/s mVms  R Peroneal - EDB     Ankle EDB 3.7 ?6.5 9.1 ?2.0 100 Ankle - EDB 9   29.7     Fib head EDB 9.2  10.3  113 Fib head - Ankle 26 47 ?44 34.5     Pop fossa EDB 11.3  9.7  94.8 Pop fossa - Fib head 10 49 ?44 33.0         Pop fossa - Ankle      L Peroneal - EDB     Ankle EDB 3.5 ?6.5 7.6 ?2.0 100 Ankle - EDB 9   21.5     Fib head EDB 8.9  8.0  105 Fib head - Ankle 27 50 ?44 24.4     Pop fossa EDB 10.8  8.1  101 Pop fossa - Fib head 10 54 ?44 25.0         Pop fossa - Ankle      R Tibial - AH     Ankle AH 3.6 ?5.8 6.6 ?4.0 100 Ankle - AH 9   19.3     Pop fossa AH 11.5  7.0  106 Pop fossa - Ankle 35 44 ?41 18.7  L Tibial - AH     Ankle AH 3.7 ?5.8 10.4 ?4.0 100 Ankle - AH 9   19.5     Pop fossa AH 11.3  9.0  86.3 Pop fossa - Ankle 36 48 ?41 19.6             SNC    Nerve / Sites Rec. Site Peak Lat Ref.   Amp Ref. Segments Distance    ms ms V V  cm  R Sural - Ankle (Calf)     Calf Ankle 2.8 ?4.4 20 ?6 Calf - Ankle 14  L Sural - Ankle (Calf)     Calf Ankle 3.0 ?4.4 26 ?6 Calf - Ankle 14  R Superficial peroneal - Ankle     Lat leg Ankle 3.8 ?4.4 10 ?6 Lat leg - Ankle 14  L Superficial peroneal - Ankle     Lat leg Ankle 3.4 ?4.4 8 ?6 Lat leg - Ankle 14             F  Wave    Nerve F Lat Ref.   ms ms  R Tibial - AH 40.6 ?56.0  L Tibial - AH 43.0 ?56.0         EMG Summary Table    Spontaneous MUAP Recruitment  Muscle IA Fib PSW Fasc Other Amp Dur. Poly Pattern  L. Vastus medialis Normal None None None _______ Normal Normal Normal Normal  L. Tibialis anterior Normal None None None _______ Normal Normal Normal Normal  L. Gastrocnemius (Medial head) Normal None None None _______ Normal Normal Normal Normal

## 2019-12-02 ENCOUNTER — Ambulatory Visit: Payer: Managed Care, Other (non HMO) | Admitting: Family Medicine

## 2019-12-02 ENCOUNTER — Other Ambulatory Visit: Payer: Self-pay

## 2019-12-02 ENCOUNTER — Encounter: Payer: Self-pay | Admitting: Family Medicine

## 2019-12-02 VITALS — BP 139/85 | HR 85 | Temp 97.6°F | Ht 61.25 in | Wt 177.2 lb

## 2019-12-02 DIAGNOSIS — Z23 Encounter for immunization: Secondary | ICD-10-CM | POA: Diagnosis not present

## 2019-12-02 DIAGNOSIS — Z8616 Personal history of COVID-19: Secondary | ICD-10-CM

## 2019-12-02 DIAGNOSIS — D869 Sarcoidosis, unspecified: Secondary | ICD-10-CM

## 2019-12-02 DIAGNOSIS — D863 Sarcoidosis of skin: Secondary | ICD-10-CM

## 2019-12-02 DIAGNOSIS — D860 Sarcoidosis of lung: Secondary | ICD-10-CM

## 2019-12-02 DIAGNOSIS — F419 Anxiety disorder, unspecified: Secondary | ICD-10-CM

## 2019-12-02 MED ORDER — ESCITALOPRAM OXALATE 20 MG PO TABS: 20.0000 mg | ORAL_TABLET | Freq: Every day | ORAL | 5 refills | Status: DC

## 2019-12-02 MED ORDER — HYDROXYCHLOROQUINE SULFATE 200 MG PO TABS: 200.0000 mg | ORAL_TABLET | Freq: Two times a day (BID) | ORAL | 2 refills | Status: DC

## 2019-12-02 NOTE — Patient Instructions (Addendum)
Continue your current medications  If anxiety, stress, or depression are getting too bad come back and rediscuss your medications and/or need for a counselor or psychologist.  Continue follow-ups with your specialists for the neurologic and rheumatologic issues.  Since [facility] is no longer here, you should reestablish with a new primary provider.  Discussed with the front staff about that.  I urged you to get your Covid vaccinations still.  Return as needed or in about 3 or 4 months.    If you have lab work done today you will be contacted with your lab results within the next 2 weeks.  If you have not heard from [place] then please contact us. The fastest way to get your results is to register for My Chart.   IF you received an x-ray today, you will receive an invoice from [facility]. Please contact [facility] at [phone] with questions or concerns regarding your invoice.   IF you received labwork today, you will receive an invoice from [facility]. Please contact [facility] at [phone] with questions or concerns regarding your invoice.   Our billing staff will not be able to assist you with questions regarding bills from these companies.  You will be contacted with the lab results as soon as they are available. The fastest way to get your results is to activate your My Chart account. Instructions are located on the last page of this paperwork. If you have not heard from [place] regarding the results in 2 weeks, please contact this office.

## 2019-12-02 NOTE — Progress Notes (Signed)
Patient ID: Mindy Brown, female    DOB: [birth year]  Age: 44 y.o. MRN: [id number]  Chief Complaint  Patient presents with  . Medication Refill    Subjective:   Patient is here for a follow-up for refill of her medications.  She is currently needing a refill on the Lexapro and hydroxychloroquine.  She has a history of sarcoid for about 8 or 10 years.  She has some pulmonary issues from that.  She developed neuropathic symptoms in her legs and has been being evaluated by neurology and rheumatology.  She is doing fairly well on the current medications.  She needs some refills today.  She understands I am not able to be her long-term caregiver, but will try to plug her in with someone else here.  She is to continue seeing the specialist.  Anxiety has been a Brown difficult this winter with a Brown stress from the Covid.  She was ill with Covid and had to receive an infusion for it but did not have to be hospitalized.  She is pretty much back to normal now.  That was back in January.  Current allergies, medications, problem list, past/family and social histories reviewed.  Objective:  BP 139/85 (BP Location: Right Arm, Patient Position: Sitting, Cuff Size: Normal)   Pulse 85   Temp 97.6 F (36.4 C)   Ht 5' 1.25" (1.556 m)   Wt 177 lb 3.2 oz (80.4 kg)   LMP 11/25/2019   SpO2 95%   BMI 33.21 kg/m   Alert and oriented and in no acute distress.  Did not do an exam today. Assessment & Plan:   Assessment: 1. Anxiety   2. Pulmonary sarcoidosis (HCC)   3. Sarcoidosis with lupus pernio   4. Need for Tdap vaccination   5. History of COVID-19       Plan: See instructions  Orders Placed This Encounter  Procedures  . Tdap vaccine greater than or equal to 7yo IM    Meds ordered this encounter  Medications  . hydroxychloroquine (PLAQUENIL) 200 MG tablet    Sig: Take 1 tablet (200 mg total) by mouth 2 (two) times daily.    Dispense:  60 tablet    Refill:  2    Pt needs an appt  for futher refills  . escitalopram (LEXAPRO) 20 MG tablet    Sig: Take 1 tablet (20 mg total) by mouth daily.    Dispense:  30 tablet    Refill:  5         Patient Instructions   Continue your current medications  If anxiety, stress, or depression are getting too bad come back and rediscuss your medications and/or need for a counselor or psychologist.  Continue follow-ups with your specialists for the neurologic and rheumatologic issues.  Since [facility] is no longer here, you should reestablish with a new primary provider.  Discussed with the front staff about that.  I urged you to get your Covid vaccinations still.  Return as needed or in about 3 or 4 months.    If you have lab work done today you will be contacted with your lab results within the next 2 weeks.  If you have not heard from [place] then please contact us. The fastest way to get your results is to register for My Chart.   IF you received an x-ray today, you will receive an invoice from [facility]. Please contact [facility] at [phone] with questions or concerns regarding your invoice.  IF you received labwork today, you will receive an invoice from [facility]. Please contact [facility] at [phone] with questions or concerns regarding your invoice.   Our billing staff will not be able to assist you with questions regarding bills from these companies.  You will be contacted with the lab results as soon as they are available. The fastest way to get your results is to activate your My Chart account. Instructions are located on the last page of this paperwork. If you have not heard from [place] regarding the results in 2 weeks, please contact this office.        Return in about 3 months (around 03/03/2020), or needs new pcp.   Ruben Reason, MD 12/02/2019

## 2019-12-11 ENCOUNTER — Ambulatory Visit
Admission: RE | Admit: 2019-12-11 | Discharge: 2019-12-11 | Disposition: A | Discharge status: Home / Self Care | Payer: Managed Care, Other (non HMO) | Source: Ambulatory Visit | Attending: Diagnostic Neuroimaging | Admitting: Diagnostic Neuroimaging

## 2019-12-11 ENCOUNTER — Other Ambulatory Visit: Payer: Self-pay

## 2019-12-11 DIAGNOSIS — D869 Sarcoidosis, unspecified: Secondary | ICD-10-CM

## 2019-12-11 DIAGNOSIS — R292 Abnormal reflex: Secondary | ICD-10-CM

## 2019-12-11 DIAGNOSIS — R200 Anesthesia of skin: Secondary | ICD-10-CM | POA: Diagnosis not present

## 2019-12-11 MED ORDER — GADOBENATE DIMEGLUMINE 529 MG/ML IV SOLN
16.0000 mL | Freq: Once | INTRAVENOUS | Status: AC | PRN
  Administered 2019-12-11: 16 mL via INTRAVENOUS

## 2019-12-12 ENCOUNTER — Other Ambulatory Visit: Payer: Self-pay | Admitting: Internal Medicine

## 2019-12-13 ENCOUNTER — Ambulatory Visit
Admission: RE | Admit: 2019-12-13 | Discharge: 2019-12-13 | Disposition: A | Discharge status: Home / Self Care | Payer: Managed Care, Other (non HMO) | Source: Ambulatory Visit | Attending: Diagnostic Neuroimaging | Admitting: Diagnostic Neuroimaging

## 2019-12-13 ENCOUNTER — Other Ambulatory Visit: Payer: Managed Care, Other (non HMO)

## 2019-12-13 ENCOUNTER — Other Ambulatory Visit: Payer: Self-pay

## 2019-12-13 DIAGNOSIS — R200 Anesthesia of skin: Secondary | ICD-10-CM | POA: Diagnosis not present

## 2019-12-13 DIAGNOSIS — R292 Abnormal reflex: Secondary | ICD-10-CM

## 2019-12-13 DIAGNOSIS — D869 Sarcoidosis, unspecified: Secondary | ICD-10-CM

## 2019-12-13 MED ORDER — GADOBENATE DIMEGLUMINE 529 MG/ML IV SOLN
16.0000 mL | Freq: Once | INTRAVENOUS | Status: AC | PRN
  Administered 2019-12-13: 16 mL via INTRAVENOUS

## 2019-12-15 ENCOUNTER — Telehealth: Payer: Self-pay | Admitting: *Deleted

## 2019-12-20 NOTE — Telephone Encounter (Signed)
Scans ok. Monitor. -VRP

## 2019-12-20 NOTE — Telephone Encounter (Signed)
Sent my chart with result notes.

## 2020-01-02 ENCOUNTER — Other Ambulatory Visit: Payer: Self-pay | Admitting: Internal Medicine

## 2020-01-03 NOTE — Telephone Encounter (Signed)
Pt is requesting a refill on predinsone 1mg  lov was 04/09/2019

## 2020-02-23 ENCOUNTER — Other Ambulatory Visit: Payer: Self-pay | Admitting: Family Medicine

## 2020-02-23 MED ORDER — GABAPENTIN 100 MG PO CAPS: 100.0000 mg | ORAL_CAPSULE | Freq: Three times a day (TID) | ORAL | 2 refills | Status: DC

## 2020-02-23 NOTE — Telephone Encounter (Signed)
Medication Refill - Medication: Gabapentin   Has the patient contacted their pharmacy? Yes.   Pt states that she is completely out of medication. Please advise.  (Agent: If no, request that the patient contact the pharmacy for the refill.) (Agent: If yes, when and what did the pharmacy advise?)  Preferred Pharmacy (with phone number or street name):  [id number] [place], [place] - [address]  [address] [place] [address]  Phone: [phone] Fax: [phone]  Hours: Not open 24 hours     Agent: Please be advised that RX refills may take up to 3 business days. We ask that you follow-up with your pharmacy.

## 2020-03-13 ENCOUNTER — Telehealth: Payer: Self-pay | Admitting: Family Medicine

## 2020-03-13 ENCOUNTER — Encounter: Payer: Managed Care, Other (non HMO) | Admitting: Family Medicine

## 2020-03-13 NOTE — Telephone Encounter (Signed)
03/13/2020 - PATIENT HAS A TRANSFER OF CARE WITH DR. Darcel Bayley TODAY (03/13/2020) AT 3:00 pm. I TRIED TO CALL TO INFORM HER OF DR. IRMA LEAVING OUR PRACTICE IN OCTOBER AND TO SEE IF SHE NEEDS TO COME IN MAYBE FOR A PHYSICAL OR MEDICATION REFILLS? I HAD TO LEAVE A MESSAGE ON HER VOICE MAIL TO RETURN MY CALL. MBC

## 2020-04-01 ENCOUNTER — Other Ambulatory Visit: Payer: Self-pay | Admitting: Family Medicine

## 2020-04-01 DIAGNOSIS — D863 Sarcoidosis of skin: Secondary | ICD-10-CM

## 2020-04-01 DIAGNOSIS — D860 Sarcoidosis of lung: Secondary | ICD-10-CM

## 2020-04-01 NOTE — Telephone Encounter (Signed)
Requested medication (s) are due for refill today: yes  Requested medication (s) are on the active medication list:yes  Last refill:  02/27/2020  Future visit scheduled:no  Notes to clinic:   This refill cannot be delegated  Requested Prescriptions  Pending Prescriptions Disp Refills   hydroxychloroquine (PLAQUENIL) 200 MG tablet [Pharmacy Med Name: HYDROXYCHLOROQUINE 200 MG TAB] 60 tablet 2    Sig: TAKE 1 TABLET BY MOUTH TWICE A DAY      Not Delegated - Analgesics:  Antirheumatic Agents - abatacept & hydroxychloroquine Failed - 04/01/2020  8:50 AM      Failed - This refill cannot be delegated      Passed - Valid encounter within last 6 months    Recent Outpatient Visits           4 months ago Anxiety   Primary Care at [facility], Sandria Bales, MD   6 months ago Neuropathy involving both lower extremities   Primary Care at [facility], Lonna Cobb, NP   6 months ago Essential hypertension   Primary Care at [facility], Lonna Cobb, NP   10 months ago Urinary frequency   Primary Care at [facility], Lonna Cobb, NP

## 2020-06-24 DIAGNOSIS — M5030 Other cervical disc degeneration, unspecified cervical region: Secondary | ICD-10-CM

## 2020-06-24 HISTORY — DX: Other cervical disc degeneration, unspecified cervical region: M50.30

## 2020-07-27 DIAGNOSIS — F5101 Primary insomnia: Secondary | ICD-10-CM | POA: Diagnosis not present

## 2020-07-27 DIAGNOSIS — R519 Headache, unspecified: Secondary | ICD-10-CM | POA: Diagnosis not present

## 2020-07-27 DIAGNOSIS — R200 Anesthesia of skin: Secondary | ICD-10-CM | POA: Diagnosis not present

## 2020-07-27 DIAGNOSIS — D869 Sarcoidosis, unspecified: Secondary | ICD-10-CM | POA: Diagnosis not present

## 2020-07-27 DIAGNOSIS — E559 Vitamin D deficiency, unspecified: Secondary | ICD-10-CM | POA: Diagnosis not present

## 2020-07-27 DIAGNOSIS — Z13220 Encounter for screening for lipoid disorders: Secondary | ICD-10-CM | POA: Diagnosis not present

## 2020-07-27 DIAGNOSIS — M1993 Secondary osteoarthritis, unspecified site: Secondary | ICD-10-CM | POA: Diagnosis not present

## 2020-07-27 DIAGNOSIS — Z0001 Encounter for general adult medical examination with abnormal findings: Secondary | ICD-10-CM | POA: Diagnosis not present

## 2020-07-27 DIAGNOSIS — R202 Paresthesia of skin: Secondary | ICD-10-CM | POA: Diagnosis not present

## 2020-08-15 ENCOUNTER — Other Ambulatory Visit: Payer: Self-pay

## 2020-08-15 ENCOUNTER — Telehealth: Payer: Self-pay

## 2020-08-15 ENCOUNTER — Ambulatory Visit: Payer: Managed Care, Other (non HMO) | Admitting: Rheumatology

## 2020-08-15 ENCOUNTER — Encounter: Payer: Self-pay | Admitting: Rheumatology

## 2020-08-15 VITALS — BP 125/81 | HR 74 | Resp 13 | Ht 62.0 in | Wt 179.0 lb

## 2020-08-15 DIAGNOSIS — M2242 Chondromalacia patellae, left knee: Secondary | ICD-10-CM

## 2020-08-15 DIAGNOSIS — G609 Hereditary and idiopathic neuropathy, unspecified: Secondary | ICD-10-CM

## 2020-08-15 DIAGNOSIS — D869 Sarcoidosis, unspecified: Secondary | ICD-10-CM | POA: Diagnosis not present

## 2020-08-15 DIAGNOSIS — M5030 Other cervical disc degeneration, unspecified cervical region: Secondary | ICD-10-CM

## 2020-08-15 DIAGNOSIS — Z79899 Other long term (current) drug therapy: Secondary | ICD-10-CM | POA: Diagnosis not present

## 2020-08-15 DIAGNOSIS — M25512 Pain in left shoulder: Secondary | ICD-10-CM

## 2020-08-15 DIAGNOSIS — M25572 Pain in left ankle and joints of left foot: Secondary | ICD-10-CM

## 2020-08-15 DIAGNOSIS — G8929 Other chronic pain: Secondary | ICD-10-CM

## 2020-08-15 DIAGNOSIS — R918 Other nonspecific abnormal finding of lung field: Secondary | ICD-10-CM | POA: Diagnosis not present

## 2020-08-15 DIAGNOSIS — R590 Localized enlarged lymph nodes: Secondary | ICD-10-CM | POA: Diagnosis not present

## 2020-08-15 DIAGNOSIS — M25571 Pain in right ankle and joints of right foot: Secondary | ICD-10-CM

## 2020-08-15 DIAGNOSIS — M818 Other osteoporosis without current pathological fracture: Secondary | ICD-10-CM

## 2020-08-15 DIAGNOSIS — R002 Palpitations: Secondary | ICD-10-CM

## 2020-08-15 DIAGNOSIS — Z7952 Long term (current) use of systemic steroids: Secondary | ICD-10-CM | POA: Insufficient documentation

## 2020-08-15 LAB — CBC WITH DIFFERENTIAL/PLATELET
Absolute Monocytes: 471 cells/uL (ref 200–950)
Basophils Absolute: 23 cells/uL (ref 0–200)
Basophils Relative: 0.3 %
Eosinophils Absolute: 30 cells/uL (ref 15–500)
Eosinophils Relative: 0.4 %
HCT: 34.6 % — ABNORMAL LOW (ref 35.0–45.0)
Hemoglobin: 11.3 g/dL — ABNORMAL LOW (ref 11.7–15.5)
Lymphs Abs: 1034 cells/uL (ref 850–3900)
MCH: 28.8 pg (ref 27.0–33.0)
MCHC: 32.7 g/dL (ref 32.0–36.0)
MCV: 88 fL (ref 80.0–100.0)
MPV: 10.8 fL (ref 7.5–12.5)
Monocytes Relative: 6.2 %
Neutro Abs: 6042 cells/uL (ref 1500–7800)
Neutrophils Relative %: 79.5 %
Platelets: 292 10*3/uL (ref 140–400)
RBC: 3.93 10*6/uL (ref 3.80–5.10)
RDW: 14.6 % (ref 11.0–15.0)
Total Lymphocyte: 13.6 %
WBC: 7.6 10*3/uL (ref 3.8–10.8)

## 2020-08-15 LAB — SEDIMENTATION RATE: Sed Rate: 9 mm/h (ref 0–20)

## 2020-08-15 LAB — COMPLETE METABOLIC PANEL WITH GFR
AG Ratio: 1.5 (calc) (ref 1.0–2.5)
ALT: 12 U/L (ref 6–29)
AST: 18 U/L (ref 10–30)
Albumin: 4.3 g/dL (ref 3.6–5.1)
Alkaline phosphatase (APISO): 31 U/L (ref 31–125)
BUN: 14 mg/dL (ref 7–25)
CO2: 25 mmol/L (ref 20–32)
Calcium: 9.1 mg/dL (ref 8.6–10.2)
Chloride: 104 mmol/L (ref 98–110)
Creat: 0.68 mg/dL (ref 0.50–1.10)
GFR, Est African American: 125 mL/min/{1.73_m2} (ref >=60)
GFR, Est Non African American: 108 mL/min/{1.73_m2} (ref >=60)
Globulin: 2.9 g/dL (calc) (ref 1.9–3.7)
Glucose, Bld: 72 mg/dL (ref 65–99)
Potassium: 4.3 mmol/L (ref 3.5–5.3)
Sodium: 138 mmol/L (ref 135–146)
Total Bilirubin: 0.8 mg/dL (ref 0.2–1.2)
Total Protein: 7.2 g/dL (ref 6.1–8.1)

## 2020-08-15 NOTE — Patient Instructions (Addendum)
Methotrexate tablets What is this medicine? METHOTREXATE (METH oh TREX ate) is a chemotherapy drug used to treat cancer including breast cancer, leukemia, and lymphoma. This medicine can also be used to treat psoriasis and certain kinds of arthritis. This medicine may be used for other purposes; ask your health care provider or pharmacist if you have questions. COMMON BRAND NAME(S): Rheumatrex, Trexall What should I tell my health care provider before I take this medicine? They need to know if you have any of these conditions:  fluid in the stomach area or lungs  if you often drink alcohol  infection or immune system problems  kidney disease or on hemodialysis  liver disease  low blood counts, like low white cell, platelet, or red cell counts  lung disease  radiation therapy  stomach ulcers  ulcerative colitis  an unusual or allergic reaction to methotrexate, other medicines, foods, dyes, or preservatives  pregnant or trying to get pregnant  breast-feeding How should I use this medicine? Take this medicine by mouth with a glass of water. Follow the directions on the prescription label. Take your medicine at regular intervals. Do not take it more often than directed. Do not stop taking except on your doctor's advice. Make sure you know why you are taking this medicine and how often you should take it. If this medicine is used for a condition that is not cancer, like arthritis or psoriasis, it should be taken weekly, NOT daily. Taking this medicine more often than directed can cause serious side effects, even death. Talk to your healthcare provider about safe handling and disposal of this medicine. You may need to take special precautions. Talk to your pediatrician regarding the use of this medicine in children. While this drug may be prescribed for selected conditions, precautions do apply. Overdosage: If you think you have taken too much of this medicine contact a poison control  center or emergency room at once. NOTE: This medicine is only for you. Do not share this medicine with others. What if I miss a dose? If you miss a dose, talk with your doctor or health care professional. Do not take double or extra doses. What may interact with this medicine? Do not take this medicine with any of the following medications:  acitretin This medicine may also interact with the following medication:  aspirin and aspirin-like medicines including salicylates  azathioprine  certain antibiotics like penicillins, tetracycline, and chloramphenicol  certain medicines that treat or prevent blood clots like warfarin, apixaban, dabigatran, and rivaroxaban  certain medicines for stomach problems like esomeprazole, omeprazole, pantoprazole  cyclosporine  dapsone  diuretics  gold  hydroxychloroquine  live virus vaccines  medicines for infection like acyclovir, adefovir, amphotericin B, bacitracin, cidofovir, foscarnet, ganciclovir, gentamicin, pentamidine, vancomycin  mercaptopurine  NSAIDs, medicines for pain and inflammation, like ibuprofen or naproxen  other cytotoxic agents  pamidronate  pemetrexed  penicillamine  phenylbutazone  phenytoin  probenecid  pyrimethamine  retinoids such as isotretinoin and tretinoin  steroid medicines like prednisone or cortisone  sulfonamides like sulfasalazine and trimethoprim/sulfamethoxazole  theophylline  zoledronic acid This list may not describe all possible interactions. Give your health care provider a list of all the medicines, herbs, non-prescription drugs, or dietary supplements you use. Also tell them if you smoke, drink alcohol, or use illegal drugs. Some items may interact with your medicine. What should I watch for while using this medicine? Avoid alcoholic drinks. This medicine can make you more sensitive to the sun. Keep out of the sun.   If you cannot avoid being in the sun, wear protective clothing  and use sunscreen. Do not use sun lamps or tanning beds/booths. You may need blood work done while you are taking this medicine. Call your doctor or health care professional for advice if you get a fever, chills or sore throat, or other symptoms of a cold or flu. Do not treat yourself. This drug decreases your body's ability to fight infections. Try to avoid being around people who are sick. This medicine may increase your risk to bruise or bleed. Call your doctor or health care professional if you notice any unusual bleeding. Be careful brushing or flossing your teeth or using a toothpick because you may get an infection or bleed more easily. If you have any dental work done, tell your dentist you are receiving this medicine. Check with your doctor or health care professional if you get an attack of severe diarrhea, nausea and vomiting, or if you sweat a lot. The loss of too much body fluid can make it dangerous for you to take this medicine. Talk to your doctor about your risk of cancer. You may be more at risk for certain types of cancers if you take this medicine. Do not become pregnant while taking this medicine or for 6 months after stopping it. Women should inform their health care provider if they wish to become pregnant or think they might be pregnant. Men should not father a child while taking this medicine and for 3 months after stopping it. There is potential for serious harm to an unborn child. Talk to your health care provider for more information. Do not breast-feed an infant while taking this medicine or for 1 week after stopping it. This medicine may make it more difficult to get pregnant or father a child. Talk to your health care provider if you are concerned about your fertility. What side effects may I notice from receiving this medicine? Side effects that you should report to your doctor or health care professional as soon as possible:  allergic reactions like skin rash, itching or  hives, swelling of the face, lips, or tongue  breathing problems or shortness of breath  diarrhea  dry, nonproductive cough  low blood counts - this medicine may decrease the number of white blood cells, red blood cells and platelets. You may be at increased risk for infections and bleeding.  mouth sores  redness, blistering, peeling or loosening of the skin, including inside the mouth  signs of infection - fever or chills, cough, sore throat, pain or trouble passing urine  signs and symptoms of bleeding such as bloody or black, tarry stools; red or dark-brown urine; spitting up blood or brown material that looks like coffee grounds; red spots on the skin; unusual bruising or bleeding from the eye, gums, or nose  signs and symptoms of kidney injury like trouble passing urine or change in the amount of urine  signs and symptoms of liver injury like dark yellow or brown urine; general ill feeling or flu-like symptoms; light-colored stools; loss of appetite; nausea; right upper belly pain; unusually weak or tired; yellowing of the eyes or skin Side effects that usually do not require medical attention (report to your doctor or health care professional if they continue or are bothersome):  dizziness  hair loss  tiredness  upset stomach  vomiting This list may not describe all possible side effects. Call your doctor for medical advice about side effects. You may report side effects to   FDA at 1-800-FDA-1088. Where should I keep my medicine? Keep out of the reach of children and pets. Store at room temperature between 20 and 25 degrees C (68 and 77 degrees F). Protect from light. Get rid of any unused medicine after the expiration date. Talk to your health care provider about how to dispose of unused medicine. Special directions may apply. NOTE: This sheet is a summary. It may not cover all possible information. If you have questions about this medicine, talk to your doctor, pharmacist,  or health care provider.  2021 Elsevier/Gold Standard (2020-01-10 10:40:39)  Standing Labs We placed an order today for your standing lab work.   Please have your standing labs drawn in 2 weeks after starting methotrexate x2 and then every 3 months  If possible, please have your labs drawn 2 weeks prior to your appointment so that the provider can discuss your results at your appointment.  We have open lab daily Monday through Thursday from 1:30-4:30 PM and Friday from 1:30-4:00 PM at the office of Dr. Pollyann Savoy, [facility].   Please be advised, all patients with office appointments requiring lab work will take precedents over walk-in lab work.  If possible, please come for your lab work on Monday and Friday afternoons, as you may experience shorter wait times. The office is located at [address], [place], [place] [address] No appointment is necessary.   Labs are drawn by [facility]. Please bring your co-pay at the time of your lab draw.  You may receive a bill from [facility] for your lab work.  If you wish to have your labs drawn at another location, please call the office 24 hours in advance to send orders.  If you have any questions regarding directions or hours of operation,  please call [phone].   As a reminder, please drink plenty of water prior to coming for your lab work. Thanks!   COVID-19 vaccine recommendations:   COVID-19 vaccine is recommended for everyone (unless you are allergic to a vaccine component), even if you are on a medication that suppresses your immune system.   If you are on Methotrexate, Cellcept (mycophenolate), Rinvoq, Harriette Ohara, and Olumiant- hold the medication for 1 week after each vaccine. Hold Methotrexate for 2 weeks after the single dose COVID-19 vaccine.    Do not take Tylenol or any anti-inflammatory medications (NSAIDs) 24 hours prior to the COVID-19 vaccination.   There is no direct evidence about the efficacy  of the COVID-19 vaccine in individuals who are on medications that suppress the immune system.   Even if you are fully vaccinated, and you are on any medications that suppress your immune system, please continue to wear a mask, maintain at least six feet social distance and practice hand hygiene.   If you develop a COVID-19 infection, please contact your PCP or our office to determine if you need monoclonal antibody infusion.  The booster vaccine is now available for immunocompromised patients.   Please see the following web sites for updated information.   [email or web address]

## 2020-08-15 NOTE — Telephone Encounter (Signed)
Patient was consented on methotrexate today in the office. Patient will call when she is ready to start medication.   Consent obtained and sent to the scan center.

## 2020-08-15 NOTE — Progress Notes (Signed)
Office Visit Note  Patient: Mindy Brown             Date of Birth: [birth year]           MRN: [id number]             PCP: Royal Hawthorn, NP Referring: Galvin Proffer, MD Visit Date: 08/15/2020 Occupation: @GUAROCC @  Subjective:  Pain in both ankles   History of Present Illness: Mindy Brown is a 45 y.o. female with history of pulmonary sarcoidosis.  She returns today after her initial visit on April 28, 2019.  She states she continues to have pain and swelling in her ankle joints.  None of the other joints are painful.  She has some shortness of breath only with exercise.  She saw Dr. April 30, 2019 in October 2020 at the time she was placed on prednisone 10 mg p.o. daily and then was tapered to 8 mg p.o. daily.  She was seen by Dr. November 2020 in April 2021 for neuropathy.  She had extensive work-up including MRI of the brain, C-spine, thoracic spine, nerve conduction velocity and EMG.  According to patient all the work-up was negative.  She has been on prednisone since 2013.  During her last visit we discussed methotrexate.  Patient states she read about methotrexate and got apprehensive and did not come for follow-up visit.  She was also diagnosed with osteoporosis.  She has been taking calcium and vitamin D.  Activities of Daily Living:  Patient reports morning stiffness for 1 minute.   Patient Reports nocturnal pain.  Difficulty dressing/grooming: Denies Difficulty climbing stairs: Denies Difficulty getting out of chair: Denies Difficulty using hands for taps, buttons, cutlery, and/or writing: Denies  Review of Systems  Constitutional: Negative for fatigue, night sweats, weight gain and weight loss.  HENT: Negative for mouth sores, trouble swallowing, trouble swallowing, mouth dryness and nose dryness.   Eyes: Negative for pain, redness, visual disturbance and dryness.  Respiratory: Negative for cough, shortness of breath and difficulty breathing.   Cardiovascular:  Positive for swelling in legs/feet. Negative for chest pain, palpitations, hypertension and irregular heartbeat.  Gastrointestinal: Negative for blood in stool, constipation and diarrhea.  Endocrine: Positive for heat intolerance. Negative for increased urination.  Genitourinary: Negative for difficulty urinating and vaginal dryness.  Musculoskeletal: Positive for arthralgias, joint pain and morning stiffness. Negative for joint swelling, myalgias, muscle weakness, muscle tenderness and myalgias.  Skin: Negative for color change, rash, hair loss, skin tightness, ulcers and sensitivity to sunlight.  Allergic/Immunologic: Negative for susceptible to infections.  Neurological: Positive for numbness. Negative for dizziness, memory loss, night sweats and weakness.  Hematological: Negative for bruising/bleeding tendency and swollen glands.  Psychiatric/Behavioral: Positive for sleep disturbance. Negative for depressed mood. The patient is not nervous/anxious.     PMFS History:  Patient Active Problem List   Diagnosis Date Noted  . Long term (current) use of systemic steroids 08/15/2020  . DDD (degenerative disc disease), cervical 08/15/2020  . Neuropathy involving both lower extremities 09/28/2019  . Labile hypertension 09/28/2019  . 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 Dysuria 05/21/2019  . Osteoporosis   . Vitamin D deficiency   . Sleeping difficulty   . Fatigue   . Arthralgia 08/24/2015  . Bruising 08/24/2015  . 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 Mediastinal lymphadenopathy 11/22/2011  . Pulmonary infiltrates 11/22/2011  . Sarcoidosis (HCC) 10/31/2011  . Heart palpitations 10/31/2011    Past Medical History:  Diagnosis Date  . Allergy   .  Anemia    takes Hemocyte plus  . Asthma    doesn't require any meds;dx in 2003  . Depression    was on celexa for a short period of time;has been off x 56yrs  . Dysuria 05/21/2019  . Fatigue   . GERD (gastroesophageal  reflux disease)    doesn't require any meds at present  . Headache(784.0)   . Heart murmur    just found out about this in 04/2011  . Joint pain   . Joint swelling   . Nocturia   . Osteoporosis   . Pneumonia    hx of;in 1999  . Polyarthralgia   . Rash    on face  . Sarcoidosis 2013  . Seasonal allergies   . Shortness of breath    sitting/lying/standing  . Sleeping difficulty   . Urticaria   . Vitamin D deficiency     Family History  Problem Relation Age of Onset  . Breast cancer Mother   . Squamous cell carcinoma Mother   . High blood pressure Father   . High blood pressure Paternal Aunt   . High blood pressure Paternal Uncle   . Heart disease Paternal Grandmother   . Leukemia Paternal Grandmother   . High blood pressure Paternal Aunt   . Atrial fibrillation Brother   . Congestive Heart Failure Brother   . Anesthesia problems Neg Hx   . Hypotension Neg Hx   . Malignant hyperthermia Neg Hx   . Pseudochol deficiency Neg Hx    Past Surgical History:  Procedure Laterality Date  . CESAREAN SECTION  1995  . CESAREAN SECTION N/A    [employer]reesia 03/10/2020  . ESOPHAGOGASTRODUODENOSCOPY    . LUNG BIOPSY     Social History   Social History Narrative   Lives alone   Immunization History  Administered Date(s) Administered  . Influenza Split 03/25/2011, 04/24/2012  . Pneumococcal Polysaccharide-23 06/24/2009  . Tdap 12/02/2019     Objective: Vital Signs: BP 125/81 (BP Location: Left Arm, Patient Position: Sitting, Cuff Size: Small)   Pulse 74   Resp 13   Ht 5\' 2"  (1.575 m)   Wt 179 lb (81.2 kg)   LMP 04/07/2020   BMI 32.74 kg/m    Physical Exam Vitals and nursing note reviewed.  Constitutional:      Appearance: She is well-developed and well-nourished.  HENT:     Head: Normocephalic and atraumatic.  Eyes:     Extraocular Movements: EOM normal.     Conjunctiva/sclera: Conjunctivae normal.  Cardiovascular:     Rate and Rhythm: Normal rate and regular  rhythm.     Pulses: Intact distal pulses.     Heart sounds: Normal heart sounds.  Pulmonary:     Effort: Pulmonary effort is normal.     Breath sounds: Normal breath sounds.  Abdominal:     General: Bowel sounds are normal.     Palpations: Abdomen is soft.  Musculoskeletal:     Cervical back: Normal range of motion.  Lymphadenopathy:     Cervical: No cervical adenopathy.  Skin:    General: Skin is warm and dry.     Capillary Refill: Capillary refill takes less than 2 seconds.  Neurological:     Mental Status: She is alert and oriented to person, place, and time.  Psychiatric:        Mood and Affect: Mood and affect normal.        Behavior: Behavior normal.      Musculoskeletal Exam:  C-spine thoracic and lumbar spine with good range of motion.  Shoulder joints, elbow joints, wrist joints, MCPs PIPs and DIPs with good range of motion with no synovitis.  Hip joints, knee joints, ankles, MTPs and PIPs with good range of motion with no synovitis.  She had discomfort on palpation of bilateral ankle joints.  CDAI Exam: CDAI Score: -- Patient Global: --; Provider Global: -- Swollen: --; Tender: -- Joint Exam 08/15/2020   No joint exam has been documented for this visit   There is currently no information documented on the homunculus. Go to the Rheumatology activity and complete the homunculus joint exam.  Investigation: No additional findings.  Imaging: No results found.  Recent Labs: Lab Results  Component Value Date   WBC 10.9 (H) 04/28/2019   HGB 11.7 04/28/2019   PLT 334 04/28/2019   NA 140 04/28/2019   K 3.5 04/28/2019   CL 103 04/28/2019   CO2 23 04/28/2019   GLUCOSE 89 04/28/2019   BUN 10 04/28/2019   CREATININE 0.73 04/28/2019   BILITOT 0.6 04/28/2019   ALKPHOS 43 11/20/2011   AST 19 04/28/2019   ALT 14 04/28/2019   PROT 6.7 10/06/2019   ALBUMIN 3.3 (L) 11/20/2011   CALCIUM 9.3 04/28/2019   GFRAA 119 04/28/2019   QFTBGOLDPLUS NEGATIVE 04/28/2019   June 27, 2018 SPEP normal, TB Gold negative, IgG mildly elevated, hepatitis B-, hepatitis C negative, HIV negative, RF negative, anti-CCP negative   08/24/15: ANA-, dsDNA-, RF<10, CCP<16, Ro-, La-, Scl-70 negative  04/09/19: G6PDH 15, Ace 26  Speciality Comments: No specialty comments available.  Procedures:  No procedures performed Allergies: Patient has no known allergies.   Assessment / Plan:     Visit Diagnoses: Sarcoidosis (HCC) - Positive lymph node biopsy.  Diagnosed by Dr. Marchelle Gearing in 2013. -Patient has been on prednisone since 2013.  He states that she is unable to taper prednisone because of frequent pulmonary flares and increased joint pain and joint swelling.  She has not seen Dr. Marchelle Gearing in the last 2 years.  She continues to have joint pain and discomfort despite being on prednisone.  She is currently on prednisone 8 mg p.o. daily.  Plan: Sedimentation rate  Mediastinal lymphadenopathy-according to patient she had biopsy-proven sarcoidosis.  Pulmonary infiltrates -treated with prednisone frequently.  High risk medication use - Use of contraception discussed, she had been on Plaquenil intermittently for the last 3 years.  She has been given Plaquenil by her PCP.  We discussed methotrexate at the last visit but patient decided not to start it after reviewing the side effects.  Detailed counseling was provided.  Indications side effects contraindications of methotrexate were discussed today.  She wants to proceed with the subcutaneous methotrexate.  She does not want to take oral methotrexate.  My plan is to start her on methotrexate 0.6 mL subcu weekly and then increase it to 0.8 mL subcu weekly after 2 weeks if the labs are stable.  She will also be on folic acid 2 mg p.o. daily.  The plan is to taper prednisone gradually and keep her on the lowest possible dose.  I would like to get her off the prednisone if possible.  I will also refer her to endocrinologist.- Plan: CBC with  Differential/Platelet, COMPLETE METABOLIC PANEL WITH GFR.  After detailed discussion patient decided that she will discuss with her fianc and then decide to start on methotrexate.  She is also considering liposuction and may want to postpone methotrexate until then.  Drug  Counseling TB Gold: June 27, 2018 Hepatitis panel: June 27, 2018  Chest-xray: April 09, 2019  Contraception: No contraceptive pills  Alcohol use: Moderate consumption every other weekend  Patient was counseled on the purpose, proper use, and adverse effects of methotrexate including nausea, infection, and signs and symptoms of pneumonitis.  Reviewed instructions with patient to take methotrexate weekly along with folic acid daily.  Discussed the importance of frequent monitoring of kidney and liver function and blood counts, and provided patient with standing lab instructions.  Counseled patient to avoid NSAIDs and alcohol while on methotrexate.  Provided patient with educational materials on methotrexate and answered all questions.  Advised patient to get annual influenza vaccine and to get a pneumococcal vaccine if patient has not already had one.  Patient voiced understanding.  Patient consented to methotrexate use.  Will upload into chart.    Long-term use of systemic steroids-has been on prednisone since 2013.  I will refer her to endocrinology for evaluation for possible adrenal insufficiency.  Once methotrexate is in her system I would like to taper prednisone.  Chronic left shoulder pain - X-ray was unremarkable in November 2020.  Chondromalacia patellae, left knee  Chronic pain of both ankles - History of intermittent swelling.  She had tenderness on palpation of her bilateral ankles today.  DDD (degenerative disc disease), cervical - MRI of the cervical spine December 14, 2019 showed C3-C4 and C4-5 mild disc bulge.   Idiopathic peripheral neuropathy - Evaluated by Dr. Marjory LiesPenumalli in April 2021.  Nerve conduction  velocity and EMG were within normal limits.  MRI of the brain was normal.  MRI of C-spine and thorac  Heart palpitations  Other osteoporosis without current pathological fracture - Most likely due to chronic steroid use.  DEXA scan August 25, 2019 showed a T score of -2.5, BMD 0.960 in the lumbar spine.  She is on calcium and vitamin D.  Patient did not receive COVID-19 vaccine.  She is not interested in getting the vaccination.  She understands the risk of getting the COVID-19 infection and the complications.  Orders: Orders Placed This Encounter  Procedures  . CBC with Differential/Platelet  . COMPLETE METABOLIC PANEL WITH GFR  . Sedimentation rate  . Ambulatory referral to Endocrinology   No orders of the defined types were placed in this encounter.  History face time spent patient was 45 minutes.  Greater than 50% time was spent in counseling and coordination of care.  Follow-Up Instructions: Return in about 6 weeks (around 09/26/2020) for Sarcoidosis.   Pollyann SavoyShaili Nafisah Runions, MD  Note - This record has been created using Animal nutritionistDragon software.  Chart creation errors have been sought, but may not always  have been located. Such creation errors do not reflect on  the standard of medical care.

## 2020-08-16 DIAGNOSIS — N6452 Nipple discharge: Secondary | ICD-10-CM | POA: Diagnosis not present

## 2020-08-16 DIAGNOSIS — Z124 Encounter for screening for malignant neoplasm of cervix: Secondary | ICD-10-CM | POA: Diagnosis not present

## 2020-08-16 DIAGNOSIS — Z01419 Encounter for gynecological examination (general) (routine) without abnormal findings: Secondary | ICD-10-CM | POA: Diagnosis not present

## 2020-08-16 NOTE — Progress Notes (Signed)
Mild anemia noted.  CMP and sed rate are normal.

## 2020-08-17 ENCOUNTER — Other Ambulatory Visit: Payer: Self-pay | Admitting: Obstetrics and Gynecology

## 2020-08-17 DIAGNOSIS — N6452 Nipple discharge: Secondary | ICD-10-CM

## 2020-09-11 DIAGNOSIS — N800 Endometriosis of uterus: Secondary | ICD-10-CM | POA: Diagnosis not present

## 2020-09-11 DIAGNOSIS — R1030 Lower abdominal pain, unspecified: Secondary | ICD-10-CM | POA: Diagnosis not present

## 2020-09-13 NOTE — Progress Notes (Deleted)
Office Visit Note  Patient: Mindy Brown             Date of Birth: 09-Feb-1976           MRN: [id number]             PCP: Royal Hawthorn, NP Referring: Royal Hawthorn, NP Visit Date: 09/26/2020 Occupation: @GUAROCC @  Subjective:  No chief complaint on file.   History of Present Illness: Mindy Brown is a 45 y.o. female ***   Activities of Daily Living:  Patient reports morning stiffness for *** {minute/hour:19697}.   Patient {ACTIONS;DENIES/REPORTS:21021675::"Denies"} nocturnal pain.  Difficulty dressing/grooming: {ACTIONS;DENIES/REPORTS:21021675::"Denies"} Difficulty climbing stairs: {ACTIONS;DENIES/REPORTS:21021675::"Denies"} Difficulty getting out of chair: {ACTIONS;DENIES/REPORTS:21021675::"Denies"} Difficulty using hands for taps, buttons, cutlery, and/or writing: {ACTIONS;DENIES/REPORTS:21021675::"Denies"}  No Rheumatology ROS completed.   PMFS History:  Patient Active Problem List   Diagnosis Date Noted  . Long term (current) use of systemic steroids 08/15/2020  . DDD (degenerative disc disease), cervical 08/15/2020  . Neuropathy involving both lower extremities 09/28/2019  . Labile hypertension 09/28/2019  . 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 Dysuria 05/21/2019  . Osteoporosis   . Vitamin D deficiency   . Sleeping difficulty   . Fatigue   . Arthralgia 08/24/2015  . Bruising 08/24/2015  . 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 Mediastinal lymphadenopathy 11/22/2011  . Pulmonary infiltrates 11/22/2011  . Sarcoidosis (HCC) 10/31/2011  . Heart palpitations 10/31/2011    Past Medical History:  Diagnosis Date  . Allergy   . Anemia    takes Hemocyte plus  . Asthma    doesn't require any meds;dx in 2003  . Depression    was on celexa for a short period of time;has been off x 84yrs  . Dysuria 05/21/2019  . Fatigue   . GERD (gastroesophageal reflux disease)    doesn't require any meds at present  . Headache(784.0)   .  Heart murmur    just found out about this in 04/2011  . Joint pain   . Joint swelling   . Nocturia   . Osteoporosis   . Pneumonia    hx of;in 1999  . Polyarthralgia   . Rash    on face  . Sarcoidosis 2013  . Seasonal allergies   . Shortness of breath    sitting/lying/standing  . Sleeping difficulty   . Urticaria   . Vitamin D deficiency     Family History  Problem Relation Age of Onset  . Breast cancer Mother   . Squamous cell carcinoma Mother   . High blood pressure Father   . High blood pressure Paternal Aunt   . High blood pressure Paternal Uncle   . Heart disease Paternal Grandmother   . Leukemia Paternal Grandmother   . High blood pressure Paternal Aunt   . Atrial fibrillation Brother   . Congestive Heart Failure Brother   . Anesthesia problems Neg Hx   . Hypotension Neg Hx   . Malignant hyperthermia Neg Hx   . Pseudochol deficiency Neg Hx    Past Surgical History:  Procedure Laterality Date  . CESAREAN SECTION  1995  . CESAREAN SECTION N/A    [facility]reesia 03/10/2020  . ESOPHAGOGASTRODUODENOSCOPY    . LUNG BIOPSY     Social History   Social History Narrative   Lives alone   Immunization History  Administered Date(s) Administered  . Influenza Split 03/25/2011, 04/24/2012  . Pneumococcal Polysaccharide-23 06/24/2009  . Tdap 12/02/2019     Objective: Vital Signs: There were  no vitals taken for this visit.   Physical Exam   Musculoskeletal Exam: ***  CDAI Exam: CDAI Score: - Patient Global: -; Provider Global: - Swollen: -; Tender: - Joint Exam 09/26/2020   No joint exam has been documented for this visit   There is currently no information documented on the homunculus. Go to the Rheumatology activity and complete the homunculus joint exam.  Investigation: No additional findings.  Imaging: No results found.  Recent Labs: Lab Results  Component Value Date   WBC 7.6 08/15/2020   HGB 11.3 (L) 08/15/2020   PLT 292 08/15/2020   NA 138  08/15/2020   K 4.3 08/15/2020   CL 104 08/15/2020   CO2 25 08/15/2020   GLUCOSE 72 08/15/2020   BUN 14 08/15/2020   CREATININE 0.68 08/15/2020   BILITOT 0.8 08/15/2020   ALKPHOS 43 11/20/2011   AST 18 08/15/2020   ALT 12 08/15/2020   PROT 7.2 08/15/2020   ALBUMIN 3.3 (L) 11/20/2011   CALCIUM 9.1 08/15/2020   GFRAA 125 08/15/2020   QFTBGOLDPLUS NEGATIVE 04/28/2019    Speciality Comments: No specialty comments available.  Procedures:  No procedures performed Allergies: Patient has no known allergies.   Assessment / Plan:     Visit Diagnoses: No diagnosis found.  Orders: No orders of the defined types were placed in this encounter.  No orders of the defined types were placed in this encounter.   Face-to-face time spent with patient was *** minutes. Greater than 50% of time was spent in counseling and coordination of care.  Follow-Up Instructions: No follow-ups on file.   Ellen Henri, CMA  Note - This record has been created using Animal nutritionist.  Chart creation errors have been sought, but may not always  have been located. Such creation errors do not reflect on  the standard of medical care.

## 2020-09-26 ENCOUNTER — Ambulatory Visit: Payer: BC Managed Care – PPO | Admitting: Physician Assistant

## 2020-09-26 DIAGNOSIS — G609 Hereditary and idiopathic neuropathy, unspecified: Secondary | ICD-10-CM

## 2020-09-26 DIAGNOSIS — Z79899 Other long term (current) drug therapy: Secondary | ICD-10-CM

## 2020-09-26 DIAGNOSIS — Z113 Encounter for screening for infections with a predominantly sexual mode of transmission: Secondary | ICD-10-CM | POA: Diagnosis not present

## 2020-09-26 DIAGNOSIS — G8929 Other chronic pain: Secondary | ICD-10-CM

## 2020-09-26 DIAGNOSIS — M2242 Chondromalacia patellae, left knee: Secondary | ICD-10-CM

## 2020-09-26 DIAGNOSIS — M818 Other osteoporosis without current pathological fracture: Secondary | ICD-10-CM

## 2020-09-26 DIAGNOSIS — Z30430 Encounter for insertion of intrauterine contraceptive device: Secondary | ICD-10-CM | POA: Diagnosis not present

## 2020-09-26 DIAGNOSIS — R918 Other nonspecific abnormal finding of lung field: Secondary | ICD-10-CM

## 2020-09-26 DIAGNOSIS — Z3202 Encounter for pregnancy test, result negative: Secondary | ICD-10-CM | POA: Diagnosis not present

## 2020-09-26 DIAGNOSIS — M5030 Other cervical disc degeneration, unspecified cervical region: Secondary | ICD-10-CM

## 2020-09-26 DIAGNOSIS — R002 Palpitations: Secondary | ICD-10-CM

## 2020-09-26 DIAGNOSIS — Z7952 Long term (current) use of systemic steroids: Secondary | ICD-10-CM

## 2020-09-26 DIAGNOSIS — R590 Localized enlarged lymph nodes: Secondary | ICD-10-CM

## 2020-09-26 DIAGNOSIS — D869 Sarcoidosis, unspecified: Secondary | ICD-10-CM

## 2020-09-27 DIAGNOSIS — Z113 Encounter for screening for infections with a predominantly sexual mode of transmission: Secondary | ICD-10-CM | POA: Diagnosis not present

## 2020-09-28 ENCOUNTER — Other Ambulatory Visit: Payer: BC Managed Care – PPO

## 2020-10-13 ENCOUNTER — Other Ambulatory Visit: Payer: BC Managed Care – PPO

## 2020-10-17 ENCOUNTER — Other Ambulatory Visit: Payer: Self-pay

## 2020-10-17 ENCOUNTER — Encounter: Payer: Self-pay | Admitting: Endocrinology

## 2020-10-17 ENCOUNTER — Ambulatory Visit: Payer: BC Managed Care – PPO | Admitting: Endocrinology

## 2020-10-17 VITALS — BP 150/80 | HR 89 | Ht 62.0 in | Wt 179.4 lb

## 2020-10-17 DIAGNOSIS — Z7952 Long term (current) use of systemic steroids: Secondary | ICD-10-CM | POA: Diagnosis not present

## 2020-10-17 MED ORDER — PREDNISONE 1 MG PO TABS: 5.0000 mg | ORAL_TABLET | Freq: Every day | ORAL | 11 refills | Status: DC

## 2020-10-17 NOTE — Patient Instructions (Signed)
To see if your body can resume making its own version of prednisone, we would first need to taper it off.  We will need to take this situation in stages:  1.  Please see Dr Sandria Manly, to see if it is OK with him to taper off the prednisone. 2.  If so, please reduce to 3 mg each morning.   3.  Please come back here approx 1 month later.

## 2020-10-17 NOTE — Progress Notes (Signed)
Subjective:    Patient ID: Mindy Brown, female    DOB: 06-29-1975, 45 y.o.   MRN: [id number]  HPI Pt is referred by Dr Titus Dubin, for steroid tapering. She has been on prednisone for sarcoidosis, since 2012.  No h/o cancer, thyroid problems, adrenal dz, seizures, hypoglycemia, amyloidosis, tuberculosis, or diabetes.  No h/o ketoconazole, rifampin, or dilantin.  she has never had adrenal imaging.  She reports chronic sxs of easy bruising and arthralgias are unchanged recently. pt has reduced prednisone to 5 mg qd, over the past few months.  She has had osteoporosis since 2019.  Past Medical History:  Diagnosis Date  . Allergy   . Anemia    takes Hemocyte plus  . Asthma    doesn't require any meds;dx in 2003  . Depression    was on celexa for a short period of time;has been off x 76yrs  . Dysuria 05/21/2019  . Fatigue   . GERD (gastroesophageal reflux disease)    doesn't require any meds at present  . Headache(784.0)   . Heart murmur    just found out about this in 04/2011  . Joint pain   . Joint swelling   . Nocturia   . Osteoporosis   . Pneumonia    hx of;in 1999  . Polyarthralgia   . Rash    on face  . Sarcoidosis 2013  . Seasonal allergies   . Shortness of breath    sitting/lying/standing  . Sleeping difficulty   . Urticaria   . Vitamin D deficiency     Past Surgical History:  Procedure Laterality Date  . CESAREAN SECTION  1995  . CESAREAN SECTION N/A    [facility]reesia 03/10/2020  . ESOPHAGOGASTRODUODENOSCOPY    . LUNG BIOPSY      Social History   Socioeconomic History  . Marital status: Legally Separated    Spouse name: Not on file  . Number of children: 2  . Years of education: Not on file  . Highest education level: Bachelor's degree (e.g., BA, AB, BS)  Occupational History    Comment: [employer]; marketing  Tobacco Use  . Smoking status: Never Smoker  . Smokeless tobacco: Never Used  Vaping Use  . Vaping Use: Never used   Substance and Sexual Activity  . Alcohol use: Yes    Comment: occasionally  . Drug use: No  . Sexual activity: Yes    Birth control/protection: Pill  Other Topics Concern  . Not on file  Social History Narrative   Lives alone   Social Determinants of Health   Financial Resource Strain: Not on file  Food Insecurity: Not on file  Transportation Needs: Not on file  Physical Activity: Not on file  Stress: Not on file  Social Connections: Not on file  Intimate Partner Violence: Not on file    Current Outpatient Medications on File Prior to Visit  Medication Sig Dispense Refill  . albuterol (VENTOLIN HFA) 108 (90 Base) MCG/ACT inhaler SMARTSIG:1 Puff(s) By Mouth Every 4 Hours PRN    . Calcium Carb-Cholecalciferol 600-800 MG-UNIT TABS Take by mouth.    . [employer] D3 50 MCG (2000 UT) CAPS Take 1 capsule by mouth daily.    . cyanocobalamin 500 MCG tablet Take 500 mcg by mouth daily.    Marland Kitchen gabapentin (NEURONTIN) 100 MG capsule Take 1 capsule (100 mg total) by mouth 3 (three) times daily. 90 capsule 2  . hydroxychloroquine (PLAQUENIL) 200 MG tablet TAKE 1 TABLET BY MOUTH  TWICE A DAY (Patient taking differently: Take 200 mg by mouth daily.) 60 tablet 2  . ibuprofen (ADVIL) 800 MG tablet 1 tablet with food or milk as needed    . LARISSIA 0.1-20 MG-MCG tablet     . traZODone (DESYREL) 50 MG tablet Take 0.5-1 tablets (25-50 mg total) by mouth at bedtime as needed for sleep. 30 tablet 3  . escitalopram (LEXAPRO) 20 MG tablet Take 1 tablet (20 mg total) by mouth daily. 30 tablet 5   No current facility-administered medications on file prior to visit.    No Known Allergies  Family History  Problem Relation Age of Onset  . Breast cancer Mother   . Squamous cell carcinoma Mother   . High blood pressure Father   . High blood pressure Paternal Aunt   . High blood pressure Paternal Uncle   . Heart disease Paternal Grandmother   . Leukemia Paternal Grandmother   . High blood pressure Paternal  Aunt   . Atrial fibrillation Brother   . Congestive Heart Failure Brother   . Anesthesia problems Neg Hx   . Hypotension Neg Hx   . Malignant hyperthermia Neg Hx   . Pseudochol deficiency Neg Hx   . Adrenal disorder Neg Hx     BP (!) 150/80 (BP Location: Right Arm, Patient Position: Sitting, Cuff Size: Large)   Pulse 89   Ht 5\' 2"  (1.575 m)   Wt 179 lb 6.4 oz (81.4 kg)   SpO2 98%   BMI 32.81 kg/m      Review of Systems Denies weight loss, fatigue, n/v, and cold intolerance.  No change in chronic doe.      Objective:   Physical Exam VS: see vs page GEN: no distress HEAD: head: no deformity eyes: no periorbital swelling, no proptosis external nose and ears are normal NECK: supple, thyroid is not enlarged CHEST WALL: no deformity LUNGS: clear to auscultation CV: reg rate and rhythm, no murmur.  MUSCULOSKELETAL: gait is normal and steady EXTEMITIES: no deformity.  no leg edema NEURO:  readily moves all 4's.  sensation is intact to touch on all 4's SKIN:  Normal texture and temperature.  No rash or suspicious lesion is visible.   NODES:  None palpable at the neck PSYCH: alert, well-oriented.  Does not appear anxious nor depressed.  MRI (2021): pituitary is normal  I have reviewed outside records, and summarized: Pt was noted to have chronic steroid use, and referred here.  She is overdue for f/u with pulmonary.       Assessment & Plan:  Chronic steroid use, new to me.  We discussed plan to taper.  First step is to make sure taper is OK with Dr 06-19-1972  Patient Instructions  To see if your body can resume making its own version of prednisone, we would first need to taper it off.  We will need to take this situation in stages:  1.  Please see Dr Sandria Manly, to see if it is OK with him to taper off the prednisone. 2.  If so, please reduce to 3 mg each morning.   3.  Please come back here approx 1 month later.

## 2020-10-18 DIAGNOSIS — Z1321 Encounter for screening for nutritional disorder: Secondary | ICD-10-CM | POA: Diagnosis not present

## 2020-10-18 DIAGNOSIS — E559 Vitamin D deficiency, unspecified: Secondary | ICD-10-CM | POA: Diagnosis not present

## 2020-10-18 DIAGNOSIS — M069 Rheumatoid arthritis, unspecified: Secondary | ICD-10-CM | POA: Diagnosis not present

## 2020-10-18 DIAGNOSIS — D869 Sarcoidosis, unspecified: Secondary | ICD-10-CM | POA: Diagnosis not present

## 2020-10-18 DIAGNOSIS — Z1329 Encounter for screening for other suspected endocrine disorder: Secondary | ICD-10-CM | POA: Diagnosis not present

## 2020-10-18 DIAGNOSIS — E538 Deficiency of other specified B group vitamins: Secondary | ICD-10-CM | POA: Diagnosis not present

## 2020-10-24 ENCOUNTER — Ambulatory Visit
Admission: RE | Admit: 2020-10-24 | Discharge: 2020-10-24 | Disposition: A | Discharge status: Home / Self Care | Payer: BC Managed Care – PPO | Source: Ambulatory Visit | Attending: Obstetrics and Gynecology | Admitting: Obstetrics and Gynecology

## 2020-10-24 ENCOUNTER — Other Ambulatory Visit: Payer: Self-pay

## 2020-10-24 DIAGNOSIS — Z30431 Encounter for routine checking of intrauterine contraceptive device: Secondary | ICD-10-CM | POA: Diagnosis not present

## 2020-10-24 DIAGNOSIS — N6452 Nipple discharge: Secondary | ICD-10-CM | POA: Diagnosis not present

## 2020-10-24 DIAGNOSIS — Z6832 Body mass index (BMI) 32.0-32.9, adult: Secondary | ICD-10-CM | POA: Diagnosis not present

## 2020-10-24 DIAGNOSIS — R922 Inconclusive mammogram: Secondary | ICD-10-CM | POA: Diagnosis not present

## 2020-10-26 ENCOUNTER — Telehealth: Payer: Self-pay | Admitting: Internal Medicine

## 2020-10-26 NOTE — Telephone Encounter (Signed)
I have called and spoke with pt and she stated that she wanted to check with MR to make sure this sounds like a good plan to him.    pts endocrinologist, Dr. Everardo All, is wanting to wean her down on her prednisone.  She is currently taking 5 mg and he will decrease her to 3 mg daily for 2 months, then stop.  Pt stated that she wanted MR to be aware and to give his opinion on this.  MR please advise. Thanks

## 2020-10-27 NOTE — Telephone Encounter (Signed)
Spoke with the pt and notified of response per MR  She verbalized understanding and nothing further needed  Appt for 11/15/20 was scheduled

## 2020-10-27 NOTE — Telephone Encounter (Signed)
In oct 2020 she was in remisssion for 3 years from lung standpoint but when she abruplty stopped prednisone her skin issues flared up. Assuming lung still okk and no skin sarcoid (has been a while since last seen) then we can slowly taper prednisone per Dr Everardo All  Also give appt to see me; first available  Thanks  MR

## 2020-11-15 ENCOUNTER — Encounter: Payer: Self-pay | Admitting: Internal Medicine

## 2020-11-15 ENCOUNTER — Ambulatory Visit: Payer: BC Managed Care – PPO | Admitting: Internal Medicine

## 2020-11-15 ENCOUNTER — Other Ambulatory Visit: Payer: Self-pay

## 2020-11-15 VITALS — BP 108/64 | HR 75 | Temp 98.1°F | Ht 62.0 in | Wt 178.6 lb

## 2020-11-15 DIAGNOSIS — D860 Sarcoidosis of lung: Secondary | ICD-10-CM

## 2020-11-15 DIAGNOSIS — D863 Sarcoidosis of skin: Secondary | ICD-10-CM

## 2020-11-15 NOTE — Progress Notes (Signed)
HPI  IOV  08/24/2015  Sarcoidosis    - Pulmonary Stage 1 leading into early stage 2:  ACE level > 100. EBUS diagnosis 11/21/11, AA female - Lupus pernio  Resolved Feb 2014 with prednisone 10 mg per day - Ankle arthropath  - Resolved February 2014 with prednisone 10 mg per day y  - Class B symptoms   - Resolved February 2014 with prednisone 10 mg per day  - Commenced prednisone 6/43/13 - Palpitations  - Cardiac evaluation normal cardiac MRI July 2013  - Normal sinus rhythm on Holter monitor July 2030     OV 08/11/2012 Followup sarcoidosis/ Mindy Brown -- She missed a 540-month appointment and is now here for six-month followup. Last seen 6 months ago. Currently taking prednisone at 10 mg per day. With this her ankle arthropathy lupus pernio in respiratory symptoms have completely resolved. She occasionally has palpitations despite being free of caffeine. She did see cardiology and had a normal cardiac MRI and normal Holter monitoring. Palpitations unexplained. It is associated with some chest tightness but no focal tenderness. She feels prednisone 10 mg per day should be her dose and she does not want to taper down further.  In terms of prednisone side effects she's had weight gain. Apparently this happened when she had her take Neurontin for neuropathy that was prescribed to the neurologist. So she stopped the Neurontin couple of months ago. She does not want to see the neurologist again. The neuropathy itself is improved rec #Sarcoidosis - Glad you're doing better in terms of neuropathy, palpitations, skin problems and respiratory issues - I have advised to decrease prednisone to 5 mg per day, but because you still do have some amount of tingling and some amount of palpitations that you feel is due to sarcoid we will continue as agreed the prednisone at 10 mg per day - I will see you in 6 months with spirometry at followup  #Long-term prednisone therapy  - Check vitamin D and  hemoglobin A1c level today  #Followup  - 6 months with spirometry at followup    05/25/2015  f/u ov/[name] re: steroid dep sarcoid  Chief Complaint  Patient presents with  . Follow-up    MR pt. Pt c/o wet cough with brown/green mucus, some wheeze, dyspnea with exertion and mid-sternal chest pain that comes and goes.   symptoms are all chronic, not acute, attributed to sarcoid but on 20 mg prednisone s taper, each time tapers has skin flare assoc with aches in multiple joints esp ankles   No obvious day to day or daytime variability or assoc  chest tightness,   overt sinus or hb symptoms. No unusual exp hx or h/o childhood pna/ asthma or knowledge of premature birth.  Sleeping ok without nocturnal  or early am exacerbation  of respiratory  c/o's or need for noct saba. Also denies any obvious fluctuation of symptoms with weather or environmental changes or other aggravating or alleviating factors except as outlined above     OV 08/24/2015  Chief Complaint  Patient presents with  . Follow-up    Pt last seen by MR on 08/11/2012 then saw MW on 05/25/2015. Pt states she was bit by an insect on left arm 3 weeks ago, pt then developed hives that turned into bruising - pt unsure if the hives were related to her sarcoid. Pt states she has occasional SOB and prod cough with green/yellow mucus x 1 week. Pt denies CP/tightness and f/c/s.  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 Since then she's been on prednisone. She missed follow-up. She last saw my partner Dr. Sherene Brown in December 2016 and at that time was advised to taper her prednisone between 20 mg and 10 mg daily [prior to that she was on 20 mg daily]. He did a chest x-ray which are visualized today and it is normal and significantly improved compared to 2013. There is hyperinflation though. She says that with the prednisone her symptoms of dyspnea have mostly resolved.  However she continues to get mild arthralgia on and off. This is improved but the prednisone helps. Dr. Sherene Brown started her on Plaquenil in December 2016 which she says helps even more and his help her calm down on the prednisone. She is having financial issues with Plaquenil but manages. No fever or weight loss. Review of records shows that for her arthralgia she has not had autoimmune workup   new issue is that 3 weeks ago she got bit by presumably a spider in her left forearm or wrist. She had hives spreading up to the left arm shortly after that. Since then she has noticed easy bruising particularly in her contralateral right upper arm lateral aspect. She is due to see an allergist soon.   OV 04/09/2019  Subjective:  Patient ID: Mindy Brown, female , DOB: 07-Jan-1976 , age 45 y.o. , MRN: [id number] , ADDRESS: [address] [place] [address]   04/09/2019 -   Chief Complaint  Patient presents with  . Pulmonary Consult    Former patient here to get re-established. She reports that she has sob and occ. cough.     follow-up stage II pulmonary sarcoidosis with ;u[us pernip B symptoms and arthralgia - x 2013    HPI Mindy Brown 45 y.o. -presents for new evaluation for the above issues.  She is actually an established patient but it has been over 3 years since I last saw her.  Therefore she is a new patient per insurance criteria.  She diagnosis of pulmonary sarcoidosis stage II in 2013 associated with lupus pernio, ACE level greater than 100 and ankle arthralgia.  Back then she had cardiac evaluation for sarcoid and was ruled out.  She also recollects having had eye exam evaluation and this was ruled out as a lesion for sarcoid.  Last visit was in 2017.  At that time she had arthralgia but a clear chest x-ray.  She tells me that since 2013 she is been on prednisone 10 mg/day.  This is causing her weight gain and she does not want to be chronically dependent on prednisone.   Therefore she wants to taper this.  She says every now and then she tries to stop prednisone but her lupus pernio on the face comes back.  Last attempt was 4 to 5 months ago.  At the time she tried to quit prednisone cold [place].  And this is what she does every time.  And that she tries to quit prednisone cold [place].  She has been working in a nursing home and because of strict COVID-19 protocol she is been masking and this gives her a some shortness of breath and wheezing periodically.  She is worried that the pulmonary sarcoidosis is back.  Also she is continuing to have a significant amount of ankle arthralgia.  In December 2016 she took Plaquenil for a few months but she  does not recollect this.  Therefore she does not know of the Plaquenil helped ankle symptoms or not.  She is willing to give this a try again.  But the main goal is that she wants to come off prednisone if possible.  In 2017 we did do autoimmune blood work for her arthralgia and this was negative.  I shared these results with her.  She does not want to do a flu shot.  Results for BRIDGETTE, WOLDEN (MRN [id number]) as of 04/09/2019 10:48  Ref. Range 10/28/2011 12:20  Angiotensin-Converting Enzyme Latest Ref Range: 8 - 52 U/L >100 (H)   Results for PRINCE, OLIVIER (MRN [id number]) as of 04/09/2019 10:48  Ref. Range 08/24/2015 14:27  Anti Nuclear Antibody (ANA) Latest Ref Range: NEGATIVE  NEG  Cyclic Citrullin Peptide Ab Latest Units: Units <16  ds DNA Ab Latest Units: IU/mL <1  RA Latex Turbid. Latest Ref Range: <=14 IU/mL <10  SSA (Ro) (ENA) Antibody, IgG Latest Ref Range: <1.0 NEG AI  <1.0 NEG  SSB (La) (ENA) Antibody, IgG Latest Ref Range: <1.0 NEG AI  <1.0 NEG  Scleroderma (Scl-70) (ENA) Antibody, IgG Latest Ref Range: <1.0 NEG AI  <1.0 NEG  CLINICAL DATA:  Followup sarcoidosis. Substernal chest pain. History of asthma.  EXAM: CHEST  2 VIEW  COMPARISON:  11/22/2011  FINDINGS: Cardiac silhouette normal in size and  configuration. Normal mediastinal and hilar contours. No evidence of adenopathy.  Clear lungs.  No evidence of interstitial lung disease.  No pleural effusion or pneumothorax.  Skeletal structures are unremarkable.  IMPRESSION: Normal chest radiographs.   Electronically Signed   By: Amie Portland M.D.   On: 05/25/2015 19:26  ROS - per HPI   OV 11/15/2020  Subjective:  Patient ID: Mindy Brown, female , DOB: Nov 13, 1975 , age 45 y.o. , MRN: [id number] , ADDRESS: [address] [place] [address] PCP Collene Mares, PA Patient Care Team: Collene Mares, [place] as PCP - General (Internal Medicine) Donnetta Hail, MD as Consulting Physician (Rheumatology)  This Provider for this visit: Treatment Team:  Attending Provider: Kalman Shan, MD    11/15/2020 -   Chief Complaint  Patient presents with  . Follow-up    Pt states she has been doing good since last visit and denies any complaints.     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 -follow-up for sarcoidosis.  Last seen in 2020.  At that time she was in remission.  ACE level and chest x-ray were clear.  She only has ankle arthralgia.  She takes Plaquenil for this and it helps.  She feels the bilateral ankle arthralgias are related to prednisone.  Recently because she is been doing well she reduced her prednisone to 5 mg/day with Dr. Romero Belling.  She wants to come off this.  The request is if she can safely come down to 3 mg/day.  She is not having any B symptoms.  No shortness of breath no chills no fever no lupus pernio.  Ankle pain is the same.  No problems going down on the prednisone so far.  She feels great.    CT Chest data  No results found.    PFT  No flowsheet data found.     has a past medical history of Allergy, Anemia, Asthma, Depression, Dysuria (05/21/2019), Fatigue, GERD (gastroesophageal reflux disease),  Headache(784.0), Heart murmur, Joint pain, Joint swelling, Nocturia,  Osteoporosis, Pneumonia, Polyarthralgia, Rash, Sarcoidosis (2013), Seasonal allergies, Shortness of breath, Sleeping difficulty, Urticaria, and Vitamin D deficiency.   reports that she has never smoked. She has never used smokeless tobacco.  Past Surgical History:  Procedure Laterality Date  . CESAREAN SECTION  1995  . CESAREAN SECTION N/A    Phreesia 03/10/2020  . ESOPHAGOGASTRODUODENOSCOPY    . LUNG BIOPSY      No Known Allergies  Immunization History  Administered Date(s) Administered  . Influenza Split 03/25/2011, 04/24/2012  . Pneumococcal Polysaccharide-23 06/24/2009  . Tdap 12/02/2019    Family History  Problem Relation Age of Onset  . Breast cancer Mother   . Squamous cell carcinoma Mother   . High blood pressure Father   . High blood pressure Paternal Aunt   . High blood pressure Paternal Uncle   . Heart disease Paternal Grandmother   . Leukemia Paternal Grandmother   . High blood pressure Paternal Aunt   . Atrial fibrillation Brother   . Congestive Heart Failure Brother   . Anesthesia problems Neg Hx   . Hypotension Neg Hx   . Malignant hyperthermia Neg Hx   . Pseudochol deficiency Neg Hx   . Adrenal disorder Neg Hx      Current Outpatient Medications:  .  albuterol (VENTOLIN HFA) 108 (90 Base) MCG/ACT inhaler, SMARTSIG:1 Puff(s) By Mouth Every 4 Hours PRN, Disp: , Rfl:  .  BIOTIN PO, Take by mouth., Disp: , Rfl:  .  CINNAMON PO, Take by mouth., Disp: , Rfl:  .  COLLAGEN PO, Take by mouth., Disp: , Rfl:  .  CVS D3 50 MCG (2000 UT) CAPS, Take 1 capsule by mouth daily., Disp: , Rfl:  .  Cyanocobalamin (VITAMIN B 12 PO), Take by mouth., Disp: , Rfl:  .  hydroxychloroquine (PLAQUENIL) 200 MG tablet, TAKE 1 TABLET BY MOUTH TWICE A DAY (Patient taking differently: Take 200 mg by mouth daily.), Disp: 60 tablet, Rfl: 2 .  ibuprofen (ADVIL) 800 MG tablet, 1 tablet with food or milk as needed, Disp:  , Rfl:  .  Maca Root (MACA PO), Take by mouth., Disp: , Rfl:  .  Multiple Minerals-Vitamins (NUTRA-SUPPORT BONE) CAPS, Take by mouth., Disp: , Rfl:  .  Multiple Vitamins-Minerals (CENTRUM WOMEN) TABS, Take by mouth., Disp: , Rfl:  .  predniSONE (DELTASONE) 1 MG tablet, Take 5 tablets (5 mg total) by mouth daily with breakfast., Disp: 150 tablet, Rfl: 11 .  Probiotic Product (PROBIOTIC DAILY PO), Take by mouth., Disp: , Rfl:  .  escitalopram (LEXAPRO) 20 MG tablet, Take 1 tablet (20 mg total) by mouth daily., Disp: 30 tablet, Rfl: 5      Objective:   Vitals:   11/15/20 1604  BP: 108/64  Pulse: 75  Temp: 98.1 F (36.7 C)  TempSrc: Temporal  SpO2: 100%  Weight: 178 lb 9.6 oz (81 kg)  Height: 5\' 2"  (1.575 m)    Estimated body mass index is 32.67 kg/m as calculated from the following:   Height as of this encounter: 5\' 2"  (1.575 m).   Weight as of this encounter: 178 lb 9.6 oz (81 kg).  @WEIGHTCHANGE @    11/15/20 1604  Weight: 178 lb 9.6 oz (81 kg)     Physical Exam   General: No distress. Looks wel Neuro: Alert and Oriented x 3. GCS 15. Speech normal Psych: Pleasant Resp:  Barrel Chest - no.  Wheeze - no, Crackles - no, No overt respiratory distress CVS: Normal heart  sounds. Murmurs - no Ext: Stigmata of Connective Tissue Disease - no HEENT: Normal upper airway. PEERL +. No post nasal drip SKin: no lupus pernio . Some scattered tattos        Assessment:       ICD-10-CM   1. Pulmonary sarcoidosis (HCC)  D86.0   2. 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        Plan:     Patient Instructions  Pulmonary sarcoidosis (HCC) Sarcoidosis with lupus pernio  -  Clinically in remission as of 5 years and cxr/ace level in 2020 was normal  - currently down to to prednisone 5mg  per day - therapies for sarcoid are changing   Plan  - do not feel strongly about getting cxr 11/15/2020 - can hold off - do slow steroid taper with Dr 11/17/2020 and come off -  ok to go to next dose of 3mg  per day and then follow Dr Romero Belling plan - visit - consider starting local support group   - we can help   Arthralgia of both ankles - stable, unrelated to prednisone per history - plaquenil that Dr Romero Belling started in 2020 is helping  plan - continstart plaquenil 200mg  twice daily but refill via [facility], 2021 E, [place]   followup  = 34-months or sooner if needed      SIGNATURE    Dr. Hyacinth Meeker, M.D., F.C.C.P,  Pulmonary and Critical Care Medicine Staff Physician, [facility] Center Director - Interstitial Lung Disease  Program  Pulmonary Fibrosis [facility] at [facility] [place], [facility], [facility]  Pager: [phone], If no answer or between  15:00h - 7:00h: call [phone] Telephone: [phone]  4:54 PM 11/15/2020

## 2020-11-15 NOTE — Patient Instructions (Addendum)
Pulmonary sarcoidosis (HCC) Sarcoidosis with lupus pernio  -  Clinically in remission as of 5 years and cxr/ace level in 2020 was normal  - currently down to to prednisone 5mg  per day - therapies for sarcoid are changing   Plan  - do not feel strongly about getting cxr 11/15/2020 - can hold off - do slow steroid taper with Dr 11/17/2020 and come off - ok to go to next dose of 3mg  per day and then follow Dr Romero Belling plan - visit - consider starting local support group   - we can help   Arthralgia of both ankles - stable, unrelated to prednisone per history - plaquenil that Dr Romero Belling started in 2020 is helping  plan - continstart plaquenil 200mg  twice daily but refill via [place], 2021 E, [place]   followup  = 6-months or sooner if needed

## 2020-11-22 HISTORY — PX: LIPOSUCTION: SHX10

## 2020-12-20 DIAGNOSIS — U099 Post covid-19 condition, unspecified: Secondary | ICD-10-CM | POA: Diagnosis not present

## 2020-12-20 DIAGNOSIS — G43909 Migraine, unspecified, not intractable, without status migrainosus: Secondary | ICD-10-CM | POA: Diagnosis not present

## 2020-12-20 DIAGNOSIS — M069 Rheumatoid arthritis, unspecified: Secondary | ICD-10-CM | POA: Diagnosis not present

## 2020-12-20 DIAGNOSIS — Z1329 Encounter for screening for other suspected endocrine disorder: Secondary | ICD-10-CM | POA: Diagnosis not present

## 2021-01-25 DIAGNOSIS — R519 Headache, unspecified: Secondary | ICD-10-CM | POA: Diagnosis not present

## 2021-01-25 DIAGNOSIS — D869 Sarcoidosis, unspecified: Secondary | ICD-10-CM | POA: Diagnosis not present

## 2021-01-30 DIAGNOSIS — F411 Generalized anxiety disorder: Secondary | ICD-10-CM | POA: Diagnosis not present

## 2021-01-30 DIAGNOSIS — R202 Paresthesia of skin: Secondary | ICD-10-CM | POA: Diagnosis not present

## 2021-01-30 DIAGNOSIS — M5442 Lumbago with sciatica, left side: Secondary | ICD-10-CM | POA: Diagnosis not present

## 2021-01-30 DIAGNOSIS — M5441 Lumbago with sciatica, right side: Secondary | ICD-10-CM | POA: Diagnosis not present

## 2021-01-31 ENCOUNTER — Other Ambulatory Visit: Payer: Self-pay | Admitting: Geriatric Medicine

## 2021-01-31 DIAGNOSIS — M5442 Lumbago with sciatica, left side: Secondary | ICD-10-CM

## 2021-02-09 ENCOUNTER — Ambulatory Visit
Admission: RE | Admit: 2021-02-09 | Discharge: 2021-02-09 | Disposition: A | Discharge status: Home / Self Care | Payer: BC Managed Care – PPO | Source: Ambulatory Visit | Attending: Geriatric Medicine | Admitting: Geriatric Medicine

## 2021-02-09 DIAGNOSIS — M5442 Lumbago with sciatica, left side: Secondary | ICD-10-CM

## 2021-02-09 DIAGNOSIS — M545 Low back pain, unspecified: Secondary | ICD-10-CM | POA: Diagnosis not present

## 2021-02-21 ENCOUNTER — Telehealth: Payer: Self-pay | Admitting: Diagnostic Neuroimaging

## 2021-02-21 DIAGNOSIS — R202 Paresthesia of skin: Secondary | ICD-10-CM | POA: Diagnosis not present

## 2021-02-21 DIAGNOSIS — R200 Anesthesia of skin: Secondary | ICD-10-CM | POA: Diagnosis not present

## 2021-02-21 NOTE — Telephone Encounter (Signed)
Pt is asking for a call to discuss the results from the MRI that she had last year
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
## 2021-02-22 NOTE — Telephone Encounter (Signed)
Pt scheduled for 02-28-21 3 pm

## 2021-02-27 ENCOUNTER — Other Ambulatory Visit: Payer: Self-pay | Admitting: *Deleted

## 2021-02-27 ENCOUNTER — Encounter: Payer: Self-pay | Admitting: *Deleted

## 2021-02-28 ENCOUNTER — Ambulatory Visit (INDEPENDENT_AMBULATORY_CARE_PROVIDER_SITE_OTHER): Payer: BC Managed Care – PPO | Admitting: Diagnostic Neuroimaging

## 2021-02-28 ENCOUNTER — Encounter: Payer: Self-pay | Admitting: Diagnostic Neuroimaging

## 2021-02-28 VITALS — BP 139/89 | HR 64 | Ht 62.0 in | Wt 176.0 lb

## 2021-02-28 DIAGNOSIS — G373 Acute transverse myelitis in demyelinating disease of central nervous system: Secondary | ICD-10-CM | POA: Diagnosis not present

## 2021-02-28 DIAGNOSIS — M79605 Pain in left leg: Secondary | ICD-10-CM

## 2021-02-28 DIAGNOSIS — M79604 Pain in right leg: Secondary | ICD-10-CM

## 2021-02-28 MED ORDER — DULOXETINE HCL 20 MG PO CPEP: 20.0000 mg | ORAL_CAPSULE | Freq: Every day | ORAL | 6 refills | Status: DC
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
## 2021-06-24 DIAGNOSIS — L940 Localized scleroderma [morphea]: Secondary | ICD-10-CM

## 2021-06-24 HISTORY — DX: Localized scleroderma (morphea): L94.0

## 2021-09-03 ENCOUNTER — Other Ambulatory Visit: Payer: Self-pay

## 2021-09-03 ENCOUNTER — Ambulatory Visit (INDEPENDENT_AMBULATORY_CARE_PROVIDER_SITE_OTHER): Payer: BC Managed Care – PPO | Admitting: Diagnostic Neuroimaging

## 2021-09-03 ENCOUNTER — Encounter: Payer: Self-pay | Admitting: Diagnostic Neuroimaging

## 2021-09-03 VITALS — BP 132/85 | HR 69 | Ht 62.0 in | Wt 154.2 lb

## 2021-09-03 DIAGNOSIS — M79605 Pain in left leg: Secondary | ICD-10-CM

## 2021-09-03 DIAGNOSIS — M79604 Pain in right leg: Secondary | ICD-10-CM

## 2021-09-03 DIAGNOSIS — R200 Anesthesia of skin: Secondary | ICD-10-CM

## 2021-09-03 MED ORDER — DULOXETINE HCL 30 MG PO CPEP: 30.0000 mg | ORAL_CAPSULE | Freq: Every day | ORAL | 4 refills | Status: DC

## 2021-09-03 NOTE — Patient Instructions (Signed)
-   try increasing duloxetine to 30mg  daily (for pain and anxiety) ?
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
## 2021-12-24 ENCOUNTER — Other Ambulatory Visit: Payer: Self-pay | Admitting: Obstetrics and Gynecology

## 2021-12-24 DIAGNOSIS — R928 Other abnormal and inconclusive findings on diagnostic imaging of breast: Secondary | ICD-10-CM

## 2022-01-01 ENCOUNTER — Other Ambulatory Visit: Payer: Self-pay | Admitting: Obstetrics and Gynecology

## 2022-01-01 ENCOUNTER — Ambulatory Visit
Admission: RE | Admit: 2022-01-01 | Discharge: 2022-01-01 | Disposition: A | Discharge status: Home / Self Care | Payer: BC Managed Care – PPO | Source: Ambulatory Visit | Attending: Obstetrics and Gynecology | Admitting: Obstetrics and Gynecology

## 2022-01-01 DIAGNOSIS — N6489 Other specified disorders of breast: Secondary | ICD-10-CM

## 2022-01-01 DIAGNOSIS — R928 Other abnormal and inconclusive findings on diagnostic imaging of breast: Secondary | ICD-10-CM

## 2022-01-02 ENCOUNTER — Other Ambulatory Visit: Payer: BC Managed Care – PPO

## 2022-06-05 ENCOUNTER — Encounter (HOSPITAL_BASED_OUTPATIENT_CLINIC_OR_DEPARTMENT_OTHER): Payer: Self-pay | Admitting: Obstetrics and Gynecology

## 2022-06-05 NOTE — Progress Notes (Signed)
Spoke w/ via phone for pre-op interview--- [facility] Lab needs dos----  CBC, UPT. And T&S per surgeon             Lab results------ COVID test -----patient states asymptomatic no test needed Arrive at -------1150 NPO after MN NO Solid Food.  Clear liquids from MN until---1050 Med rec completed Medications to take morning of surgery -----Synthroid and Lessina Diabetic medication ----- Patient instructed no nail polish to be worn day of surgery Patient instructed to bring photo id and insurance card day of surgery Patient aware to have Driver (ride ) / caregiver Husband Yoona Ishii   for 24 hours after surgery  Patient Special Instructions ----- Pre-Op special Istructions ----- Patient verbalized understanding of instructions that were given at this phone interview. Patient denies shortness of breath, chest pain, fever, cough at this phone interview.
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
## 2022-06-07 ENCOUNTER — Encounter (HOSPITAL_BASED_OUTPATIENT_CLINIC_OR_DEPARTMENT_OTHER)
Admission: RE | Disposition: A | Discharge status: Home / Self Care | Payer: Self-pay | Source: Ambulatory Visit | Attending: Obstetrics and Gynecology

## 2022-06-07 ENCOUNTER — Other Ambulatory Visit: Payer: Self-pay

## 2022-06-07 ENCOUNTER — Ambulatory Visit (HOSPITAL_BASED_OUTPATIENT_CLINIC_OR_DEPARTMENT_OTHER): Payer: BC Managed Care – PPO | Admitting: Anesthesiology

## 2022-06-07 ENCOUNTER — Ambulatory Visit (HOSPITAL_BASED_OUTPATIENT_CLINIC_OR_DEPARTMENT_OTHER)
Admission: RE | Admit: 2022-06-07 | Discharge: 2022-06-07 | Disposition: A | Discharge status: Home / Self Care | Payer: BC Managed Care – PPO | Source: Ambulatory Visit | Attending: Obstetrics and Gynecology | Admitting: Obstetrics and Gynecology

## 2022-06-07 ENCOUNTER — Encounter (HOSPITAL_BASED_OUTPATIENT_CLINIC_OR_DEPARTMENT_OTHER): Payer: Self-pay | Admitting: Obstetrics and Gynecology

## 2022-06-07 DIAGNOSIS — E039 Hypothyroidism, unspecified: Secondary | ICD-10-CM | POA: Diagnosis not present

## 2022-06-07 DIAGNOSIS — G709 Myoneural disorder, unspecified: Secondary | ICD-10-CM | POA: Diagnosis not present

## 2022-06-07 DIAGNOSIS — M1990 Unspecified osteoarthritis, unspecified site: Secondary | ICD-10-CM | POA: Diagnosis not present

## 2022-06-07 DIAGNOSIS — K219 Gastro-esophageal reflux disease without esophagitis: Secondary | ICD-10-CM | POA: Insufficient documentation

## 2022-06-07 DIAGNOSIS — F32A Depression, unspecified: Secondary | ICD-10-CM | POA: Insufficient documentation

## 2022-06-07 DIAGNOSIS — N939 Abnormal uterine and vaginal bleeding, unspecified: Secondary | ICD-10-CM | POA: Diagnosis present

## 2022-06-07 DIAGNOSIS — Z01818 Encounter for other preprocedural examination: Secondary | ICD-10-CM

## 2022-06-07 DIAGNOSIS — J45909 Unspecified asthma, uncomplicated: Secondary | ICD-10-CM | POA: Diagnosis not present

## 2022-06-07 DIAGNOSIS — D869 Sarcoidosis, unspecified: Secondary | ICD-10-CM | POA: Diagnosis not present

## 2022-06-07 HISTORY — PX: DILITATION & CURRETTAGE/HYSTROSCOPY WITH NOVASURE ABLATION: SHX5568

## 2022-06-07 HISTORY — PX: LAPAROSCOPIC BILATERAL SALPINGECTOMY: SHX5889

## 2022-06-07 LAB — CBC
HCT: 32.4 % — ABNORMAL LOW (ref 36.0–46.0)
Hemoglobin: 10.6 g/dL — ABNORMAL LOW (ref 12.0–15.0)
MCH: 28.7 pg (ref 26.0–34.0)
MCHC: 32.7 g/dL (ref 30.0–36.0)
MCV: 87.8 fL (ref 80.0–100.0)
Platelets: 269 10*3/uL (ref 150–400)
RBC: 3.69 MIL/uL — ABNORMAL LOW (ref 3.87–5.11)
RDW: 16.7 % — ABNORMAL HIGH (ref 11.5–15.5)
WBC: 4.8 10*3/uL (ref 4.0–10.5)
nRBC: 0.4 % — ABNORMAL HIGH (ref 0.0–0.2)

## 2022-06-07 LAB — TYPE AND SCREEN
ABO/RH(D): AB POS
Antibody Screen: NEGATIVE

## 2022-06-07 LAB — POCT PREGNANCY, URINE: Preg Test, Ur: NEGATIVE

## 2022-06-07 SURGERY — SALPINGECTOMY, BILATERAL, LAPAROSCOPIC
Anesthesia: General | Site: Vagina

## 2022-06-07 MED ORDER — KETOROLAC TROMETHAMINE 30 MG/ML IJ SOLN: 30.0000 mg | Freq: Once | INTRAMUSCULAR | Status: DC | PRN

## 2022-06-07 MED ORDER — SODIUM CHLORIDE 0.9 % IR SOLN
Status: DC | PRN
  Administered 2022-06-07: 3000 mL
  Administered 2022-06-07: 1000 mL

## 2022-06-07 MED ORDER — ONDANSETRON HCL 4 MG/2ML IJ SOLN
INTRAMUSCULAR | Status: DC | PRN
  Administered 2022-06-07: 4 mg via INTRAVENOUS

## 2022-06-07 MED ORDER — OXYCODONE HCL 5 MG PO TABS: 5.0000 mg | ORAL_TABLET | Freq: Once | ORAL | Status: DC | PRN

## 2022-06-07 MED ORDER — BUPIVACAINE HCL (PF) 0.25 % IJ SOLN
INTRAMUSCULAR | Status: DC | PRN
  Administered 2022-06-07: 10 mL

## 2022-06-07 MED ORDER — MIDAZOLAM HCL 2 MG/2ML IJ SOLN
INTRAMUSCULAR | Status: AC
  Filled 2022-06-07: qty 2

## 2022-06-07 MED ORDER — HEMOSTATIC AGENTS (NO CHARGE) OPTIME
TOPICAL | Status: DC | PRN
  Administered 2022-06-07: 1

## 2022-06-07 MED ORDER — MIDAZOLAM HCL 2 MG/2ML IJ SOLN
INTRAMUSCULAR | Status: DC | PRN
  Administered 2022-06-07: 2 mg via INTRAVENOUS

## 2022-06-07 MED ORDER — SUGAMMADEX SODIUM 200 MG/2ML IV SOLN
INTRAVENOUS | Status: DC | PRN
  Administered 2022-06-07: 300 mg via INTRAVENOUS

## 2022-06-07 MED ORDER — DEXAMETHASONE SODIUM PHOSPHATE 10 MG/ML IJ SOLN
INTRAMUSCULAR | Status: DC | PRN
  Administered 2022-06-07: 10 mg via INTRAVENOUS

## 2022-06-07 MED ORDER — MEPERIDINE HCL 25 MG/ML IJ SOLN: 6.2500 mg | INTRAMUSCULAR | Status: DC | PRN

## 2022-06-07 MED ORDER — KETOROLAC TROMETHAMINE 30 MG/ML IJ SOLN
INTRAMUSCULAR | Status: DC | PRN
  Administered 2022-06-07: 30 mg via INTRAVENOUS

## 2022-06-07 MED ORDER — HYDROMORPHONE HCL 1 MG/ML IJ SOLN: 0.2500 mg | INTRAMUSCULAR | Status: DC | PRN

## 2022-06-07 MED ORDER — FENTANYL CITRATE (PF) 100 MCG/2ML IJ SOLN
INTRAMUSCULAR | Status: AC
  Filled 2022-06-07: qty 2

## 2022-06-07 MED ORDER — KETOROLAC TROMETHAMINE 30 MG/ML IJ SOLN
INTRAMUSCULAR | Status: AC
  Filled 2022-06-07: qty 1

## 2022-06-07 MED ORDER — ONDANSETRON HCL 4 MG/2ML IJ SOLN
INTRAMUSCULAR | Status: AC
  Filled 2022-06-07: qty 2

## 2022-06-07 MED ORDER — ROCURONIUM BROMIDE 10 MG/ML (PF) SYRINGE
PREFILLED_SYRINGE | INTRAVENOUS | Status: AC
  Filled 2022-06-07: qty 10

## 2022-06-07 MED ORDER — ROCURONIUM BROMIDE 10 MG/ML (PF) SYRINGE
PREFILLED_SYRINGE | INTRAVENOUS | Status: DC | PRN
  Administered 2022-06-07: 70 mg via INTRAVENOUS

## 2022-06-07 MED ORDER — OXYCODONE HCL 5 MG/5ML PO SOLN: 5.0000 mg | Freq: Once | ORAL | Status: DC | PRN

## 2022-06-07 MED ORDER — LIDOCAINE 2% (20 MG/ML) 5 ML SYRINGE
INTRAMUSCULAR | Status: DC | PRN
  Administered 2022-06-07: 60 mg via INTRAVENOUS

## 2022-06-07 MED ORDER — PROPOFOL 10 MG/ML IV BOLUS
INTRAVENOUS | Status: AC
  Filled 2022-06-07: qty 20

## 2022-06-07 MED ORDER — ONDANSETRON HCL 4 MG/2ML IJ SOLN: 4.0000 mg | Freq: Once | INTRAMUSCULAR | Status: DC | PRN

## 2022-06-07 MED ORDER — LACTATED RINGERS IV SOLN: INTRAVENOUS | Status: DC

## 2022-06-07 MED ORDER — LIDOCAINE HCL (PF) 2 % IJ SOLN
INTRAMUSCULAR | Status: AC
  Filled 2022-06-07: qty 5

## 2022-06-07 MED ORDER — ACETAMINOPHEN 500 MG PO TABS
ORAL_TABLET | ORAL | Status: AC
  Filled 2022-06-07: qty 2

## 2022-06-07 MED ORDER — PROPOFOL 10 MG/ML IV BOLUS
INTRAVENOUS | Status: DC | PRN
  Administered 2022-06-07: 200 mg via INTRAVENOUS
  Administered 2022-06-07: 50 mg via INTRAVENOUS

## 2022-06-07 MED ORDER — DEXAMETHASONE SODIUM PHOSPHATE 10 MG/ML IJ SOLN
INTRAMUSCULAR | Status: AC
  Filled 2022-06-07: qty 2

## 2022-06-07 MED ORDER — ACETAMINOPHEN 325 MG PO TABS: 650.0000 mg | ORAL_TABLET | Freq: Four times a day (QID) | ORAL | Status: DC | PRN

## 2022-06-07 MED ORDER — PHENYLEPHRINE 80 MCG/ML (10ML) SYRINGE FOR IV PUSH (FOR BLOOD PRESSURE SUPPORT)
PREFILLED_SYRINGE | INTRAVENOUS | Status: DC | PRN
  Administered 2022-06-07: 80 ug via INTRAVENOUS
  Administered 2022-06-07: 160 ug via INTRAVENOUS

## 2022-06-07 MED ORDER — FENTANYL CITRATE (PF) 100 MCG/2ML IJ SOLN
INTRAMUSCULAR | Status: DC | PRN
  Administered 2022-06-07 (×2): 50 ug via INTRAVENOUS

## 2022-06-07 MED ORDER — ACETAMINOPHEN 500 MG PO TABS
1000.0000 mg | ORAL_TABLET | ORAL | Status: AC
  Administered 2022-06-07: 1000 mg via ORAL

## 2022-06-07 MED ORDER — AMISULPRIDE (ANTIEMETIC) 5 MG/2ML IV SOLN: 10.0000 mg | Freq: Once | INTRAVENOUS | Status: DC | PRN

## 2022-06-07 MED ORDER — PHENYLEPHRINE 80 MCG/ML (10ML) SYRINGE FOR IV PUSH (FOR BLOOD PRESSURE SUPPORT)
PREFILLED_SYRINGE | INTRAVENOUS | Status: AC
  Filled 2022-06-07: qty 10

## 2022-06-07 MED ORDER — LIDOCAINE HCL 1 % IJ SOLN
INTRAMUSCULAR | Status: DC | PRN
  Administered 2022-06-07: 10 mL

## 2022-06-07 MED ORDER — DEXMEDETOMIDINE HCL IN NACL 80 MCG/20ML IV SOLN
INTRAVENOUS | Status: DC | PRN
  Administered 2022-06-07: 12 ug via BUCCAL
  Administered 2022-06-07: 4 ug via BUCCAL

## 2022-06-07 SURGICAL SUPPLY — 39 items
ABLATOR SURESOUND NOVASURE (ABLATOR) ×2 IMPLANT
ADH SKN CLS APL DERMABOND .7 (GAUZE/BANDAGES/DRESSINGS) ×2
APL SRG 38 LTWT LNG FL B (MISCELLANEOUS) ×2
APPLICATOR ARISTA FLEXITIP XL (MISCELLANEOUS) IMPLANT
CATH ROBINSON RED A/P 16FR (CATHETERS) ×2 IMPLANT
DERMABOND ADVANCED .7 DNX12 (GAUZE/BANDAGES/DRESSINGS) ×2 IMPLANT
DEVICE MYOSURE LITE (MISCELLANEOUS) IMPLANT
DURAPREP 26ML APPLICATOR (WOUND CARE) ×2 IMPLANT
GAUZE 4X4 16PLY ~~LOC~~+RFID DBL (SPONGE) ×2 IMPLANT
GLOVE BIO SURGEON STRL SZ 6 (GLOVE) ×2 IMPLANT
GLOVE BIOGEL PI IND STRL 6.5 (GLOVE) ×4 IMPLANT
GLOVE BIOGEL PI IND STRL 7.0 (GLOVE) ×2 IMPLANT
GOWN STRL REUS W/TWL LRG LVL3 (GOWN DISPOSABLE) ×4 IMPLANT
HEMOSTAT ARISTA ABSORB 3G PWDR (HEMOSTASIS) IMPLANT
IV NS 1000ML (IV SOLUTION) ×2
IV NS 1000ML BAXH (IV SOLUTION) IMPLANT
IV NS IRRIG 3000ML ARTHROMATIC (IV SOLUTION) IMPLANT
KIT PROCEDURE FLUENT (KITS) ×2 IMPLANT
KIT TURNOVER CYSTO (KITS) ×2 IMPLANT
LIGASURE VESSEL 5MM BLUNT TIP (ELECTROSURGICAL) IMPLANT
NDL INSUFFLATION 14GA 120MM (NEEDLE) ×2 IMPLANT
NDL SAFETY ECLIP 18X1.5 (MISCELLANEOUS) IMPLANT
NEEDLE INSUFFLATION 14GA 120MM (NEEDLE) ×2 IMPLANT
PACK LAPAROSCOPY BASIN (CUSTOM PROCEDURE TRAY) ×2 IMPLANT
PACK TRENDGUARD 450 HYBRID PRO (MISCELLANEOUS) IMPLANT
PACK VAGINAL MINOR WOMEN LF (CUSTOM PROCEDURE TRAY) ×2 IMPLANT
PAD OB MATERNITY 4.3X12.25 (PERSONAL CARE ITEMS) ×2 IMPLANT
PROTECTOR NERVE ULNAR (MISCELLANEOUS) ×4 IMPLANT
SEAL ROD LENS SCOPE MYOSURE (ABLATOR) IMPLANT
SET SUCTION IRRIG HYDROSURG (IRRIGATION / IRRIGATOR) ×2 IMPLANT
SET TUBE SMOKE EVAC HIGH FLOW (TUBING) ×2 IMPLANT
SUT MNCRL AB 3-0 PS2 27 (SUTURE) ×2 IMPLANT
SYR 20ML LL LF (SYRINGE) IMPLANT
SYR 30ML LL (SYRINGE) IMPLANT
TOWEL OR 17X26 10 PK STRL BLUE (TOWEL DISPOSABLE) ×2 IMPLANT
TRAY FOLEY W/BAG SLVR 14FR (SET/KITS/TRAYS/PACK) ×2 IMPLANT
TRENDGUARD 450 HYBRID PRO PACK (MISCELLANEOUS) ×2
TROCAR Z-THREAD FIOS 5X100MM (TROCAR) ×6 IMPLANT
WARMER LAPAROSCOPE (MISCELLANEOUS) ×2 IMPLANT

## 2022-06-07 NOTE — Transfer of Care (Signed)
Immediate Anesthesia Transfer of Care Note Immediate Anesthesia Transfer of Care Note  Patient: Cathey D Borba  Procedure(s) Performed: Procedure(s) (LRB): LAPAROSCOPIC BILATERAL SALPINGECTOMY (Bilateral) DILATATION & CURETTAGE/HYSTEROSCOPY WITH MYOSURE RESECTION, AND NOVASURE ABLATION (N/A)  Patient Location: PACU  Anesthesia Type: General  Level of Consciousness:drowsy  Airway & Oxygen Therapy: Patient Spontanous Breathing   Post-op Assessment: Report given to PACU RN and Post -op Vital signs reviewed and stable  Post vital signs: Reviewed and stable  Complications: No apparent anesthesia complications Last Vitals:  Vitals Value Taken Time  BP 132/82 06/07/22 1519  Temp 36.9 C 06/07/22 1519  Pulse 79 06/07/22 1522  Resp 17 06/07/22 1522  SpO2 99 % 06/07/22 1522  Vitals shown include unvalidated device data.  Last Pain:  Vitals:   06/07/22 1226  TempSrc: Oral  PainSc: 0-No pain      Patients Stated Pain Goal: 7 (06/07/22 1226)  Complications: No notable events documented.

## 2022-06-07 NOTE — Interval H&P Note (Signed)
History and Physical Interval Note:  06/07/2022 1:25 PM  Mindy Brown  has presented today for surgery, with the diagnosis of abnormal uterine bleeding.  The various methods of treatment have been discussed with the patient and family. After consideration of risks, benefits and other options for treatment, the patient has consented to  Procedure(s): LAPAROSCOPIC BILATERAL SALPINGECTOMY (Bilateral) DILATATION & CURETTAGE/HYSTEROSCOPY WITH NOVASURE ABLATION (N/A) as a surgical intervention.  The patient's history has been reviewed, patient examined, no change in status, stable for surgery.  I have reviewed the patient's chart and labs.  Questions were answered to the patient's satisfaction.     [name]

## 2022-06-07 NOTE — Discharge Instructions (Addendum)
  Post Anesthesia Home Care Instructions  Activity: Get plenty of rest for the remainder of the day. A responsible individual must stay with you for 24 hours following the procedure.  For the next 24 hours, DO NOT: -Drive a car -Operate machinery -Drink alcoholic beverages -Take any medication unless instructed by your physician -Make any legal decisions or sign important papers.  Meals: Start with liquid foods such as gelatin or soup. Progress to regular foods as tolerated. Avoid greasy, spicy, heavy foods. If nausea and/or vomiting occur, drink only clear liquids until the nausea and/or vomiting subsides. Call your physician if vomiting continues.  Special Instructions/Symptoms: Your throat may feel dry or sore from the anesthesia or the breathing tube placed in your throat during surgery. If this causes discomfort, gargle with warm salt water. The discomfort should disappear within 24 hours.  DISCHARGE INSTRUCTIONS: HYSTEROSCOPY / ENDOMETRIAL ABLATION The following instructions have been prepared to help you care for yourself upon your return home.  May take stool softner while taking narcotic pain medication to prevent constipation.  Drink plenty of water.  Personal hygiene:  Use sanitary pads for vaginal drainage, not tampons.  Shower the day after your procedure.  NO tub baths, pools or Jacuzzis for 2-3 weeks.  Wipe front to back after using the bathroom.  Activity and limitations:  Do NOT drive or operate any equipment for 24 hours. The effects of anesthesia are still present and drowsiness may result.  Do NOT rest in bed all day.  Walking is encouraged.  Walk up and down stairs slowly.  You may resume your normal activity in one to two days or as indicated by your physician. Sexual activity: NO intercourse for at least 2 weeks after the procedure, or as indicated by your Doctor.  Diet: Eat a light meal as desired this evening. You may resume your usual diet  tomorrow.  Return to Work: You may resume your work activities in one to two days or as indicated by your Doctor.  What to expect after your surgery: Expect to have vaginal bleeding/discharge for 2-3 days and spotting for up to 10 days. It is not unusual to have soreness for up to 1-2 weeks. You may have a slight burning sensation when you urinate for the first day. Mild cramps may continue for a couple of days. You may have a regular period in 2-6 weeks.  Call your doctor for any of the following:  Excessive vaginal bleeding or clotting, saturating and changing one pad every hour.  Inability to urinate 6 hours after discharge from hospital.  Pain not relieved by pain medication.  Fever of 100.4 F or greater.  Unusual vaginal discharge or odor.       

## 2022-06-07 NOTE — Anesthesia Procedure Notes (Signed)
Procedure Name: Intubation Date/Time: 06/07/2022 2:02 PM  Performed by: Mechele Claude, CRNAPre-anesthesia Checklist: Patient identified, Emergency Drugs available, Suction available and Patient being monitored Patient Re-evaluated:Patient Re-evaluated prior to induction Oxygen Delivery Method: Circle system utilized Preoxygenation: Pre-oxygenation with 100% oxygen Induction Type: IV induction Ventilation: Mask ventilation without difficulty Laryngoscope Size: Mac and 3 Grade View: Grade I Tube type: Oral Number of attempts: 1 Airway Equipment and Method: Stylet and Oral airway Placement Confirmation: ETT inserted through vocal cords under direct vision, positive ETCO2 and breath sounds checked- equal and bilateral Secured at: 21 cm Tube secured with: Tape Dental Injury: Teeth and Oropharynx as per pre-operative assessment

## 2022-06-07 NOTE — Op Note (Signed)
06/07/2022   3:14 PM   PATIENT:  Mindy Brown  46 y.o. female   PRE-OPERATIVE DIAGNOSIS:  abnormal uterine bleeding, history of sarcoidosis   POST-OPERATIVE DIAGNOSIS:  abnormal uterine bleeding, history of sarcoidosis   PROCEDURE:  Procedure(s): LAPAROSCOPIC BILATERAL SALPINGECTOMY (Bilateral) DILATATION & CURETTAGE/HYSTEROSCOPY WITH MYOSURE RESECTION, AND NOVASURE ABLATION (N/A)   SURGEON:  Surgeon(s) and Role:    * [name], MD - Primary    * Shivaji, Valerie Roys, MD - Assisting   An experienced assistant was required given the standard of surgical care given the complexity of the case.  This assistant was needed for exposure, suctioning, retraction, instrument exchange, and for overall help during the procedure.   ANESTHESIA:   local and general   EBL:  73ml    BLOOD ADMINISTERED:none   DRAINS: Urinary Catheter (Foley)    LOCAL MEDICATIONS USED:  MARCAINE   , LIDOCAINE , and Amount: 10 ml   SPECIMEN:  Source of Specimen:  bilateral fallopian tubes, endometrial curettings   DISPOSITION OF SPECIMEN:  PATHOLOGY   COUNTS:  YES   TOURNIQUET:  * No tourniquets in log *   DICTATION: .Note written in EPIC   PLAN OF CARE: Discharge to home after PACU   PATIENT DISPOSITION:  PACU - hemodynamically stable.   Delay start of Pharmacological VTE agent (>24hrs) due to surgical blood loss or risk of bleeding: not applicable  COMPLICATIONS: none  FINDINGS:  Anteverted uterus sounded to 10cm.   On laparoscopic view, uterine serosa covered in clear thick serous material. 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 Unable to clearly identify left ovary. Right ovary and fallopian tube normal appearing. Grossly normal appearing appendix and upper abdomen.   On hysteroscopic view, bilateral ostia visualized with diffusely fluffy and thickened endometrial tissue. Excellent post-ablation effect. Total  hysteroscopic fluid deficit 37ml.  Uterine cavity length 5cm Uterine cavity width 3.1cm Burn time 1:32  Due to adhesive disease, if hysterectomy indicated in future, would do robotically.    PROCEDURE IN DETAIL:   The patient was was appropriately consented and taken to the operative room where thromboguards were applied to the lower extremities and general anesthesia was obtained without difficulty. She was placed in the dorsal lithotomy position with her legs in stirrups. The patient was prepped and draped in normal sterile fashion. A foley catheter was inserted and the bladder was emptied. A speculum was placed into the vagina. The anterior lip of the cervix was grasped with a tenaculum.  A hulka uterine manipulator was introduced and the speculum was removed.    Attention was turned to the abdominal field. A 50mm vertical skin incision was made at the base of the umbilicus. A veress needle was inserted through the incision into the abdominal cavity. Correct placement was confirmed by opening pressure of . Pneumoperitoneum was established with the CO2 gas to a pressure of .The veress needle was removed and a 4mm trocar was inserted into the abdomen cavity under direct laparoscopic visualization.  An intraabdominal survey revealed no visceral or vascular injury from entry. In the right lower quadrant, a 8mm trocar was inserted into the abdomen under direct laparoscopic visualization. In the left lower quadrant, a 66mm trocar was inserted into the abdomen under direct laparoscopic visualization.   Findings as noted above.    Attention was turned to the left fallopian tube which was bluntly dissected off the posterior uterine serosa. Using the Ligasure  device, the mesosalpinx was serially ligated until the left fallopian tube was completely freed. The fallopian tube was removed through the 29mm port. The same was repeated on the right side. The pneumoperitoneum was released and attention was  turned to the vaginal field for Novasure procedure.  A long speculum was inserted into the vagina. A single-tooth tenaculum was used to grasp the anterior lip of the cervix. A paracervical block was obtained by injecting a total of 70mL of 1% lidocaine at the 4 and 8 o'clock positions at the cervicovaginal junction. The cervical length was measured to be 5cm. The uterus was sounded to 10cm. The cervical os was sequentially dilated using Pratt dilators to accommodate the hysteroscope. The hysteroscope was introduced under direct visualization and the uterus was distended with normal saline. Bilateral ostia were visualized. Findings as noted above. The Myosure Lite device was used to obtain global endometrial curettings. Specimen was collected for pathology. The hysteroscope was removed. The Novasure endometrial ablation procedure was then performed. The array was first deployed and inspected. The device was then introduced into the uterus. The uterine cavity width was measured to be 3.1cm. The cavity assessment was successfully completed followed by initiation of the ablation cycle which was completed without difficulty. The device burned for 1 min 32 seconds. A hysteroscope was introduced into the uterine cavity to reveal charred endometrium and adequate ablation.   The tenaculum was removed from the cervix and good hemostasis was noted. All instruments were removed from the vagina.  Attention was returned once more to the vaginal field. Pneumoperitoneum was re-established. Some diffuse oozing was noted at the site of blunt adhesion dissection but no active bleeding. Arista was applied and hemostasis was appreciated. Pneumoperitoneum was released and all instruments were removed from the abdomen. The skin incisions were closed with 3-0 monocryl suture in a subcuticular fashion. The sites were infiltrated with 0.25% marcaine. The incisions were covered with dermabond. All instruments were removed from the  vagina.  Hemostasis was noted at the cervical tenaculum puncture sites. The foley catheter was removed. The patient was awakened from general anesthesia and taken to the recovery room in stable condition.  Luther Redo, MD 06/07/22 3:27 PM

## 2022-06-07 NOTE — Anesthesia Preprocedure Evaluation (Addendum)
Anesthesia Evaluation  Patient identified by MRN, date of birth, ID band Patient awake    Reviewed: Allergy & Precautions, NPO status , Patient's Chart, lab work & pertinent test results  Airway Mallampati: II  TM Distance: >3 FB Neck ROM: Full    Dental no notable dental hx.    Pulmonary asthma (well controlled)  Sarcoid diagnosed 2013-    Pulmonary exam normal breath sounds clear to auscultation       Cardiovascular Normal cardiovascular exam Rhythm:Regular Rate:Normal     Neuro/Psych  Headaches PSYCHIATRIC DISORDERS  Depression     Neuromuscular disease (peripheral neuropathy B/L LE)    GI/Hepatic Neg liver ROS,GERD  Controlled,,  Endo/Other  Hypothyroidism    Renal/GU negative Renal ROS  negative genitourinary   Musculoskeletal  (+) Arthritis ,    Abdominal   Peds  Hematology negative hematology ROS (+)   Anesthesia Other Findings   Reproductive/Obstetrics AUB                             Anesthesia Physical Anesthesia Plan  ASA: 2  Anesthesia Plan: General   Post-op Pain Management: Tylenol PO (pre-op)* and Toradol IV (intra-op)*   Induction: Intravenous  PONV Risk Score and Plan: 4 or greater and Ondansetron, Dexamethasone, Midazolam and Treatment may vary due to age or medical condition  Airway Management Planned: Oral ETT  Additional Equipment: None  Intra-op Plan:   Post-operative Plan: Extubation in OR  Informed Consent: I have reviewed the patients History and Physical, chart, labs and discussed the procedure including the risks, benefits and alternatives for the proposed anesthesia with the patient or authorized representative who has indicated his/her understanding and acceptance.     Dental advisory given  Plan Discussed with: CRNA  Anesthesia Plan Comments:        Anesthesia Quick Evaluation

## 2022-06-07 NOTE — Anesthesia Postprocedure Evaluation (Signed)
Anesthesia Post Note  Patient: Mindy Brown  Procedure(s) Performed: LAPAROSCOPIC BILATERAL SALPINGECTOMY (Bilateral: Abdomen) DILATATION & CURETTAGE/HYSTEROSCOPY WITH MYOSURE RESECTION, AND NOVASURE ABLATION (Vagina )     Patient location during evaluation: PACU Anesthesia Type: General Level of consciousness: awake and alert, oriented and patient cooperative Pain management: pain level controlled Vital Signs Assessment: post-procedure vital signs reviewed and stable Respiratory status: spontaneous breathing, nonlabored ventilation and respiratory function stable Cardiovascular status: blood pressure returned to baseline and stable Postop Assessment: no apparent nausea or vomiting Anesthetic complications: no   No notable events documented.  Last Vitals:  Vitals:   06/07/22 1530 06/07/22 1545  BP: (!) 144/82 (!) 146/91  Pulse: 74 77  Resp: 17 15  Temp:    SpO2: 98% 98%    Last Pain:  Vitals:   06/07/22 1545  TempSrc:   PainSc: 0-No pain                 Lannie Fields

## 2022-06-07 NOTE — Brief Op Note (Signed)
06/07/2022  3:14 PM  PATIENT:  Mindy Brown  46 y.o. female  PRE-OPERATIVE DIAGNOSIS:  abnormal uterine bleeding, history of sarcoidosis  POST-OPERATIVE DIAGNOSIS:  abnormal uterine bleeding, history of sarcoidosis  PROCEDURE:  Procedure(s): LAPAROSCOPIC BILATERAL SALPINGECTOMY (Bilateral) DILATATION & CURETTAGE/HYSTEROSCOPY WITH MYOSURE RESECTION, AND NOVASURE ABLATION (N/A)  SURGEON:  Surgeon(s) and Role:    * [name], MD - Primary    * Shivaji, Valerie Roys, MD - Assisting  An experienced assistant was required given the standard of surgical care given the complexity of the case.  This assistant was needed for exposure, suctioning, retraction, instrument exchange, and for overall help during the procedure.  ANESTHESIA:   local and general  EBL:  3ml   BLOOD ADMINISTERED:none  DRAINS: Urinary Catheter (Foley)   LOCAL MEDICATIONS USED:  MARCAINE   , LIDOCAINE , and Amount: 10 ml  SPECIMEN:  Source of Specimen:  bilateral fallopian tubes, endometrial curettings  DISPOSITION OF SPECIMEN:  PATHOLOGY  COUNTS:  YES  TOURNIQUET:  * No tourniquets in log *  DICTATION: .Note written in EPIC  PLAN OF CARE: Discharge to home after PACU  PATIENT DISPOSITION:  PACU - hemodynamically stable.   Delay start of Pharmacological VTE agent (>24hrs) due to surgical blood loss or risk of bleeding: not applicable

## 2022-06-10 LAB — SURGICAL PATHOLOGY

## 2022-06-11 ENCOUNTER — Encounter (HOSPITAL_BASED_OUTPATIENT_CLINIC_OR_DEPARTMENT_OTHER): Payer: Self-pay | Admitting: Obstetrics and Gynecology

## 2022-06-19 ENCOUNTER — Encounter (HOSPITAL_BASED_OUTPATIENT_CLINIC_OR_DEPARTMENT_OTHER): Payer: Self-pay | Admitting: Obstetrics and Gynecology

## 2022-06-19 NOTE — Addendum Note (Signed)
Addendum  created 06/19/22 0858 by Courtany Mcmurphy M, DO   Intraprocedure Event edited, Intraprocedure Staff edited    

## 2022-07-09 ENCOUNTER — Ambulatory Visit: Admission: RE | Admit: 2022-07-09 | Payer: BC Managed Care – PPO | Source: Ambulatory Visit

## 2022-07-09 ENCOUNTER — Ambulatory Visit
Admission: RE | Admit: 2022-07-09 | Discharge: 2022-07-09 | Disposition: A | Discharge status: Home / Self Care | Payer: BC Managed Care – PPO | Source: Ambulatory Visit | Attending: Obstetrics and Gynecology | Admitting: Obstetrics and Gynecology

## 2022-07-09 DIAGNOSIS — N6489 Other specified disorders of breast: Secondary | ICD-10-CM

## 2022-07-10 ENCOUNTER — Other Ambulatory Visit: Payer: Self-pay | Admitting: Obstetrics and Gynecology

## 2022-07-10 DIAGNOSIS — N6489 Other specified disorders of breast: Secondary | ICD-10-CM

## 2022-07-26 ENCOUNTER — Emergency Department (HOSPITAL_BASED_OUTPATIENT_CLINIC_OR_DEPARTMENT_OTHER)
Admission: EM | Admit: 2022-07-26 | Discharge: 2022-07-27 | Disposition: A | Discharge status: Home / Self Care | Payer: BC Managed Care – PPO | Attending: Emergency Medicine | Admitting: Emergency Medicine

## 2022-07-26 ENCOUNTER — Emergency Department (HOSPITAL_BASED_OUTPATIENT_CLINIC_OR_DEPARTMENT_OTHER): Payer: BC Managed Care – PPO

## 2022-07-26 ENCOUNTER — Encounter (HOSPITAL_BASED_OUTPATIENT_CLINIC_OR_DEPARTMENT_OTHER): Payer: Self-pay | Admitting: Urology

## 2022-07-26 DIAGNOSIS — R202 Paresthesia of skin: Secondary | ICD-10-CM | POA: Diagnosis not present

## 2022-07-26 DIAGNOSIS — R200 Anesthesia of skin: Secondary | ICD-10-CM | POA: Diagnosis present

## 2022-07-26 DIAGNOSIS — R531 Weakness: Secondary | ICD-10-CM | POA: Diagnosis not present

## 2022-07-26 DIAGNOSIS — E876 Hypokalemia: Secondary | ICD-10-CM

## 2022-07-26 LAB — BASIC METABOLIC PANEL
Anion gap: 4 — ABNORMAL LOW (ref 5–15)
BUN: 13 mg/dL (ref 6–20)
CO2: 27 mmol/L (ref 22–32)
Calcium: 8.6 mg/dL — ABNORMAL LOW (ref 8.9–10.3)
Chloride: 104 mmol/L (ref 98–111)
Creatinine, Ser: 0.75 mg/dL (ref 0.44–1.00)
GFR, Estimated: >60 mL/min (ref >60)
Glucose, Bld: 73 mg/dL (ref 70–99)
Potassium: 3.1 mmol/L — ABNORMAL LOW (ref 3.5–5.1)
Sodium: 135 mmol/L (ref 135–145)

## 2022-07-26 LAB — CBC
HCT: 32.3 % — ABNORMAL LOW (ref 36.0–46.0)
Hemoglobin: 10.5 g/dL — ABNORMAL LOW (ref 12.0–15.0)
MCH: 28.8 pg (ref 26.0–34.0)
MCHC: 32.5 g/dL (ref 30.0–36.0)
MCV: 88.7 fL (ref 80.0–100.0)
Platelets: 271 10*3/uL (ref 150–400)
RBC: 3.64 MIL/uL — ABNORMAL LOW (ref 3.87–5.11)
RDW: 17.2 % — ABNORMAL HIGH (ref 11.5–15.5)
WBC: 4.7 10*3/uL (ref 4.0–10.5)
nRBC: 0 % (ref 0.0–0.2)

## 2022-07-26 NOTE — ED Notes (Signed)
Diagnosed with Morphia in 12/23 as well per pt

## 2022-07-26 NOTE — ED Triage Notes (Signed)
This am woke up with right bottom lip numbness  States throughout the day right arm and right leg heaviness started and has progressed   BEFAST negative Sensation equal on face and legs, slight sensation difference in right arm   H/o transverse myelitis, same problem at age 47 H/o sarcoidosis in 2013

## 2022-07-27 ENCOUNTER — Other Ambulatory Visit: Payer: Self-pay

## 2022-07-27 ENCOUNTER — Emergency Department (HOSPITAL_COMMUNITY): Payer: BC Managed Care – PPO

## 2022-07-27 MED ORDER — POTASSIUM CHLORIDE CRYS ER 20 MEQ PO TBCR
40.0000 meq | EXTENDED_RELEASE_TABLET | Freq: Once | ORAL | Status: AC
  Administered 2022-07-27: 40 meq via ORAL
  Filled 2022-07-27: qty 2

## 2022-07-27 NOTE — ED Provider Notes (Signed)
[facility] Provider Note   CSN: [id number] Arrival date & time: 07/26/22  2132     History  Chief Complaint  Patient presents with   Numbness    Mindy Brown is a 47 y.o. female.  The history is provided by the patient and the spouse.  Patient with history of sarcoidosis presents with right-sided numbness and weakness.  Patient reports she woke up on the morning of February 2 with numbness on her right lip.  Throughout the day she has had numbness and heaviness in the right arm and right leg.  She reports it is now stable and not worsening.  No fevers or vomiting.  She has had intermittent blurred vision and is felt like her speech has been off. no Chest pain.  No new neck or back pain. 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     Home Medications Prior to Admission medications   Medication Sig Start Date End Date Taking? Authorizing Provider  acetaminophen (TYLENOL) 325 MG tablet Take 2 tablets (650 mg total) by mouth every 6 (six) hours as needed. 06/07/22   Rowland Lathe, MD  BIOTIN PO Take by mouth.    [provider]  CINNAMON PO Take by mouth.    [provider]  COLLAGEN PO Take by mouth.    [provider]  [employer]S D3 50 MCG (2000 UT) CAPS Take 1 capsule by mouth daily. 09/22/19   [provider]  Cyanocobalamin (VITAMIN B 12 PO) Take by mouth.    [provider]  ibuprofen (ADVIL) 600 MG tablet Take 1 tablet (600 mg total) by mouth every 6 (six) hours as needed. 06/07/22   Rowland Lathe, MD  levothyroxine (SYNTHROID) 50 MCG tablet Take 50 mcg by mouth every morning. 12/29/20   [provider]  oxyCODONE (OXY IR/ROXICODONE) 5 MG immediate release tablet Take 1 tablet (5 mg total) by mouth every 4 (four) hours as needed for severe pain. 06/07/22   Rowland Lathe, MD  Probiotic Product (PROBIOTIC DAILY PO) Take by mouth.     [provider]      Allergies    Patient has no known allergies.    Review of Systems   Review of Systems  Constitutional:  Negative for fever.  Eyes:  Positive for visual disturbance.  Cardiovascular:  Negative for chest pain.  Neurological:  Positive for weakness and numbness.    Physical Exam Updated Vital Signs BP (!) 164/95   Pulse 85   Temp 98.1 F (36.7 C)   Resp 19   Ht 1.575 m (5\' 2" )   Wt 69.8 kg   SpO2 100%   BMI 28.15 kg/m  Physical Exam CONSTITUTIONAL: Well developed/well nourished HEAD: Normocephalic/atraumatic EYES: EOMI/PERRL, no nystagmus, no visual field deficit  no ptosis ENMT: Mucous membranes moist NECK: supple no meningeal signs, no bruits CV: S1/S2 noted, no murmurs/rubs/gallops noted LUNGS: Lungs are clear to auscultation bilaterally, no apparent distress ABDOMEN: soft, nontender, no rebound or guarding GU:no cva tenderness NEURO:Awake/alert, face symmetric, no arm or leg drift is noted Equal 5/5 strength with shoulder abduction, elbow flex/extension, wrist flex/extension in upper extremities and equal hand grips bilaterally Equal 5/5 strength with hip flexion,knee flex/extension, foot dorsi/plantar flexion Cranial nerves 3/4/5/6/12/30/08/11/12 tested and intact She reports numbness in her right leg and the tips of her fingers on the right hand No clonus in lower extremities EXTREMITIES: pulses normalx4, full ROM  SKIN: warm, color normal PSYCH: Tearful  ED Results / Procedures / Treatments   Labs (all labs ordered are listed, but only abnormal results are displayed) Labs Reviewed  CBC - Abnormal; Notable for the following components:      Result Value   RBC 3.64 (*)    Hemoglobin 10.5 (*)    HCT 32.3 (*)    RDW 17.2 (*)    All other components within normal limits  BASIC METABOLIC PANEL - Abnormal; Notable for the following components:   Potassium 3.1 (*)    Calcium 8.6 (*)    Anion gap 4 (*)    All other components within  normal limits    EKG ED ECG REPORT   Date: 07/27/2022 0008  Rate: 80  Rhythm: normal sinus rhythm  QRS Axis: right  Intervals: normal  ST/T Wave abnormalities: normal  Conduction Disutrbances:none  Narrative Interpretation:   Old EKG Reviewed: unchanged  I have personally reviewed the EKG tracing and agree with the computerized printout as noted.   Radiology CT Head Wo Contrast  Result Date: 07/26/2022 CLINICAL DATA:  Headache EXAM: CT HEAD WITHOUT CONTRAST TECHNIQUE: Contiguous axial images were obtained from the base of the skull through the vertex without intravenous contrast. RADIATION DOSE REDUCTION: This exam was performed according to the departmental dose-optimization program which includes automated exposure control, adjustment of the mA and/or kV according to patient size and/or use of iterative reconstruction technique. COMPARISON:  None Available. FINDINGS: Brain: There is no mass, hemorrhage or extra-axial collection. The size and configuration of the ventricles and extra-axial CSF spaces are normal. The brain parenchyma is normal, without acute or chronic infarction. Vascular: No abnormal hyperdensity of the major intracranial arteries or dural venous sinuses. No intracranial atherosclerosis. Skull: The visualized skull base, calvarium and extracranial soft tissues are normal. Sinuses/Orbits: No fluid levels or advanced mucosal thickening of the visualized paranasal sinuses. No mastoid or middle ear effusion. The orbits are normal. IMPRESSION: Normal head CT. Electronically Signed   By: Ulyses Jarred M.D.   On: 07/26/2022 22:12    Procedures Procedures    Medications Ordered in ED Medications - No data to display  ED Course/ Medical Decision Making/ A&P Clinical Course as of 07/27/22 0012  Sat Jul 27, 2022  0010 Patient has had right-sided numbness in her face, arm and leg for at least 12 hours. There is no signs of CVA on my exam.  Initial CT head negative.  I offered  patient mission, but she declines but will drive with her husband to [facility] for MRI brain.  She will then try to follow-up as an outpatient with neurology if negative.  Patient reports distant history of transverse myelitis but this appears more consistent with CVA/TIA [DW]  0011 accepted at [facility] by Dr. Randal Buba for MRI brain without contrast [DW]    Clinical Course User Index [DW] Ripley Fraise, MD                             Medical Decision Making Amount and/or Complexity of Data Reviewed Radiology: ordered. ECG/medicine tests: ordered.   This patient presents to the ED for concern of numbness, this involves an extensive number of treatment options, and is a complaint that carries with it a high risk of complications and morbidity.  The differential diagnosis includes but is not limited to CVA, intracranial hemorrhage, acute coronary syndrome, renal failure, urinary tract infection, electrolyte disturbance, pneumonia  Comorbidities that complicate the patient evaluation: Patient's presentation is complicated by their history of distant history of transfer myelitis   Additional history obtained: Additional history obtained from spouse Records reviewed  reviewed neurology notes  Lab Tests: I Ordered, and personally interpreted labs.  The pertinent results include: Labs overall unremarkable  Imaging Studies ordered: I ordered imaging studies including CT scan head   I independently visualized and interpreted imaging which showed no acute findings I agree with the radiologist interpretation   Reevaluation: After the interventions noted above, I reevaluated the patient and found that they have :stayed the same  Complexity of problems addressed: Patient's presentation is most consistent with  acute presentation with potential threat to life or bodily function  Disposition: After consideration of the diagnostic results and the patient's response to treatment,  I  feel that the patent would benefit from transfer to higher level of care for MRI.           Final Clinical Impression(s) / ED Diagnoses Final diagnoses:  Paresthesia    Rx / DC Orders ED Discharge Orders          Ordered    Ambulatory referral to Neurology       Comments: An appointment is requested in approximately: 1 week   07/27/22 0007              Ripley Fraise, MD 07/27/22 [phone]

## 2022-07-27 NOTE — ED Provider Notes (Signed)
Patient transfer from [facility], initially was seen and evaluated by Dr. Christy Gentles.  Patient sent here for brain MRI.  Please see his note for complete H&P.  This is a 47 year old female presenting with complaints of tingling numbness sensation to the right side of her lips and face arm and leg for more than 12 hours.  Remote history of transverse myelitis.  Initial head CT scan was unremarkable.  I have reviewed patient's labs and imaging as well as interpreting and I agree with radiology interpretation.  Patient's labs significant for potassium of 3.1, will give supplementation.  Brain MRI without acute changes.  Patient has an old right cerebellar infarct which I mention to patient.  On exam, patient is laying in bed appears to be in no acute discomfort.  Heart with normal rate and rhythm, lungs clear to auscultation bilaterally abdomen is soft nontender.  She does not have any focal neurodeficit during my examination.  She has equal strength throughout.  Very mild subjective decrease sensation to the right side of face, right arm and right thigh.  4:34 AM This time, I felt patient was stable to be discharged.  She was initially given the options of admission for TIA/stroke workup.  Patient felt she can follow-up closely with her neurologist for outpatient evaluation.  I gave patient return precaution.  BP (!) 144/80 (BP Location: Left Arm)   Pulse 85   Temp 98.3 F (36.8 C) (Oral)   Resp 17   Ht 5\' 2"  (1.575 m)   Wt 69.8 kg   SpO2 100%   BMI 28.15 kg/m   Results for orders placed or performed during the hospital encounter of 07/26/22  CBC  Result Value Ref Range   WBC 4.7 4.0 - 10.5 K/uL   RBC 3.64 (L) 3.87 - 5.11 MIL/uL   Hemoglobin 10.5 (L) 12.0 - 15.0 g/dL   HCT 32.3 (L) 36.0 - 46.0 %   MCV 88.7 80.0 - 100.0 fL   MCH 28.8 26.0 - 34.0 pg   MCHC 32.5 30.0 - 36.0 g/dL   RDW 17.2 (H) 11.5 - 15.5 %   Platelets 271 150 - 400 K/uL   nRBC 0.0 0.0 - 0.2 %  Basic metabolic  panel  Result Value Ref Range   Sodium 135 135 - 145 mmol/L   Potassium 3.1 (L) 3.5 - 5.1 mmol/L   Chloride 104 98 - 111 mmol/L   CO2 27 22 - 32 mmol/L   Glucose, Bld 73 70 - 99 mg/dL   BUN 13 6 - 20 mg/dL   Creatinine, Ser 0.75 0.44 - 1.00 mg/dL   Calcium 8.6 (L) 8.9 - 10.3 mg/dL   GFR, Estimated >60 >60 mL/min   Anion gap 4 (L) 5 - 15   MR BRAIN WO CONTRAST  Result Date: 07/27/2022 CLINICAL DATA:  Numbness and tingling EXAM: MRI HEAD WITHOUT CONTRAST TECHNIQUE: Multiplanar, multiecho pulse sequences of the brain and surrounding structures were obtained without intravenous contrast. COMPARISON:  12/11/2019 FINDINGS: Brain: No acute infarct, mass effect or extra-axial collection. No chronic microhemorrhage or siderosis. Normal white matter signal, parenchymal volume and CSF spaces. Old right cerebellar infarct. Normal midline structures. Vascular: Normal flow voids. Skull and upper cervical spine: Normal marrow signal. Sinuses/Orbits: Negative. Other: None. IMPRESSION: 1. No acute intracranial abnormality. 2. Old right cerebellar infarct. Electronically Signed   By: Ulyses Jarred M.D.   On: 07/27/2022 02:44   CT Head Wo Contrast  Result Date: 07/26/2022 CLINICAL DATA:  Headache EXAM:  CT HEAD WITHOUT CONTRAST TECHNIQUE: Contiguous axial images were obtained from the base of the skull through the vertex without intravenous contrast. RADIATION DOSE REDUCTION: This exam was performed according to the departmental dose-optimization program which includes automated exposure control, adjustment of the mA and/or kV according to patient size and/or use of iterative reconstruction technique. COMPARISON:  None Available. FINDINGS: Brain: There is no mass, hemorrhage or extra-axial collection. The size and configuration of the ventricles and extra-axial CSF spaces are normal. The brain parenchyma is normal, without acute or chronic infarction. Vascular: No abnormal hyperdensity of the major intracranial  arteries or dural venous sinuses. No intracranial atherosclerosis. Skull: The visualized skull base, calvarium and extracranial soft tissues are normal. Sinuses/Orbits: No fluid levels or advanced mucosal thickening of the visualized paranasal sinuses. No mastoid or middle ear effusion. The orbits are normal. IMPRESSION: Normal head CT. Electronically Signed   By: Ulyses Jarred M.D.   On: 07/26/2022 22:12   MM DIAG BREAST TOMO UNI RIGHT  Result Date: 07/09/2022 CLINICAL DATA:  47 year old female presenting for first six-month follow-up of a probably benign right breast asymmetry without ultrasound correlate. EXAM: DIGITAL DIAGNOSTIC UNILATERAL RIGHT MAMMOGRAM WITH TOMOSYNTHESIS TECHNIQUE: Right digital diagnostic mammography and breast tomosynthesis was performed. COMPARISON:  Previous exam(s). ACR Breast Density Category c: The breast tissue is heterogeneously dense, which may obscure small masses. FINDINGS: An asymmetry in the deep central right breast best seen on the cc projection is mammographically stable. Otherwise, no new or suspicious findings within the right breast. IMPRESSION: Stable, probably benign right breast asymmetry. Recommend continued short-term follow-up. RECOMMENDATION: Bilateral diagnostic mammogram in 6 months. I have discussed the findings and recommendations with the patient. If applicable, a reminder letter will be sent to the patient regarding the next appointment. BI-RADS CATEGORY  3: Probably benign. Electronically Signed   By: Kristopher Oppenheim M.D.   On: 07/09/2022 15:27     Domenic Moras, PA-C 07/27/22 0436    Palumbo, April, MD 07/27/22 [id number]

## 2022-07-27 NOTE — Discharge Instructions (Addendum)
You have been evaluated for your symptoms.  A brain MRI obtained today did not show any acute stroke.  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  Please follow-up closely with your neurologist for outpatient evaluation.  Your potassium level is low today and you did receive supplementation.  Eat a banana daily and have your blood recheck in a week.

## 2022-08-06 ENCOUNTER — Ambulatory Visit (INDEPENDENT_AMBULATORY_CARE_PROVIDER_SITE_OTHER): Payer: BC Managed Care – PPO | Admitting: Diagnostic Neuroimaging

## 2022-08-06 ENCOUNTER — Encounter: Payer: Self-pay | Admitting: Diagnostic Neuroimaging

## 2022-08-06 VITALS — BP 122/78 | HR 76 | Ht 62.0 in | Wt 155.0 lb

## 2022-08-06 DIAGNOSIS — G43109 Migraine with aura, not intractable, without status migrainosus: Secondary | ICD-10-CM | POA: Diagnosis not present

## 2022-08-06 DIAGNOSIS — R200 Anesthesia of skin: Secondary | ICD-10-CM | POA: Diagnosis not present
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
## 2022-08-06 NOTE — Patient Instructions (Signed)
RIGHT FACE numbness, right arm and leg heaviness; blurred right eye vision; mild HA; mild nausea (07/26/22) - check carotid u/s, TTE (TIA is possible, but less likely since long duration of symptoms and negative MRI; will see cardiology this afternoon) - could represent migraine phenomenon (prior history of migraine) - consider MRI brain / CN5 / orbits (with and without); but will hold off since some symptoms are improving  RIGHT CEREBELLAR IMAGING ABNORMALITY (chronic since 2021; no prior stroke symptoms related to this area; neuro exam is normal; suspect this is a chronic non-specific finding rather than chronic infarct / stroke) - 2021 report --> "Few small T2 hyperintensities in the right cerebral hemisphere without gliosis may represent prominent cerebellar sulci, congenital variant or less likely chronic infarcts." - no symptoms related to this finding - no strong indication for aspirin or statin at this time

## 2023-01-10 ENCOUNTER — Ambulatory Visit
Admission: RE | Admit: 2023-01-10 | Discharge: 2023-01-10 | Disposition: A | Discharge status: Home / Self Care | Payer: BC Managed Care – PPO | Source: Ambulatory Visit | Attending: Obstetrics and Gynecology | Admitting: Obstetrics and Gynecology

## 2023-01-10 ENCOUNTER — Ambulatory Visit
Admission: RE | Admit: 2023-01-10 | Discharge: 2023-01-10 | Disposition: A | Discharge status: Home / Self Care | Payer: 59 | Source: Ambulatory Visit | Attending: Obstetrics and Gynecology | Admitting: Obstetrics and Gynecology

## 2023-01-10 ENCOUNTER — Other Ambulatory Visit: Payer: Self-pay | Admitting: Obstetrics and Gynecology

## 2023-01-10 DIAGNOSIS — N6489 Other specified disorders of breast: Secondary | ICD-10-CM

## 2023-02-02 IMAGING — MR MR LUMBAR SPINE W/O CM
4 of 5 series · 27 of 48 positions shown · non-contrast
Comparison: None.

CLINICAL DATA: Low back pain with bilateral numbness and tingling
of both legs.

EXAM:
MRI LUMBAR SPINE WITHOUT CONTRAST
TECHNIQUE: Multiplanar, multisequence MR imaging of the lumbar spine was
performed. No intravenous contrast was administered.

[Series 3: T2 · sagittal · 4.0mm · 0.53mm/px · 6 of 15 slices shown (1 of 2)]
[im 1/15]
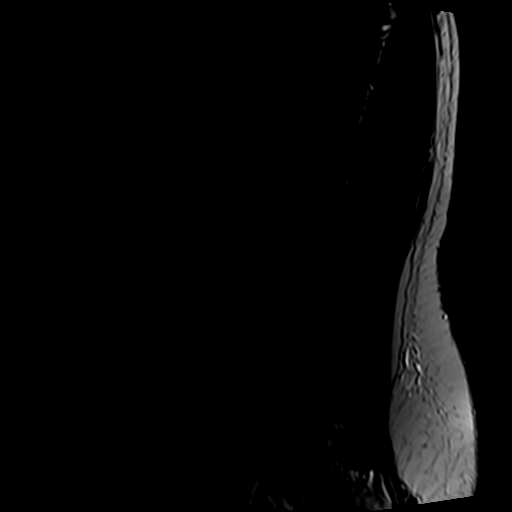
[im 3/15]
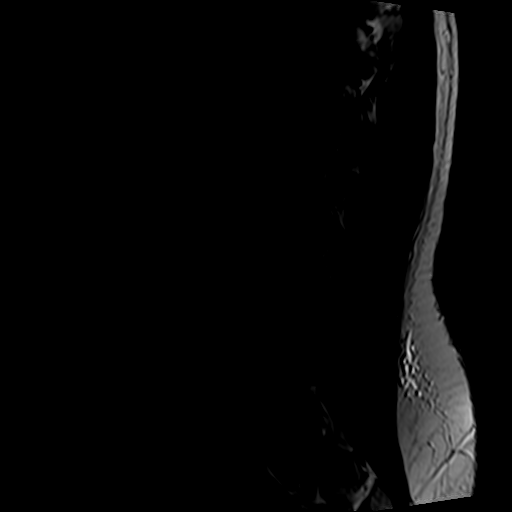
[im 6/15]
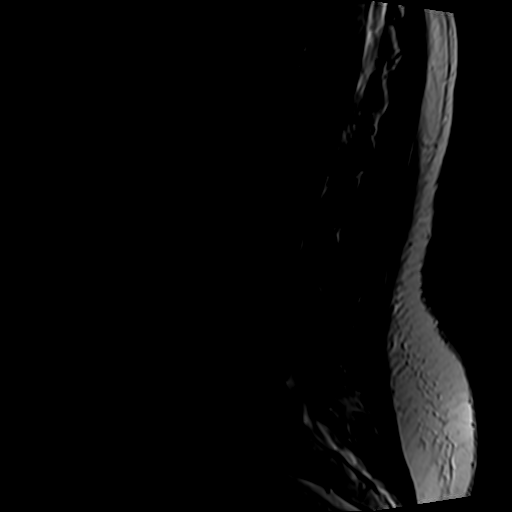
[im 9/15]
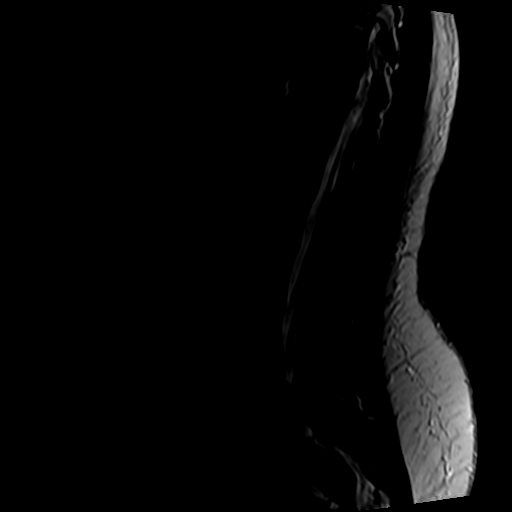
[im 12/15]
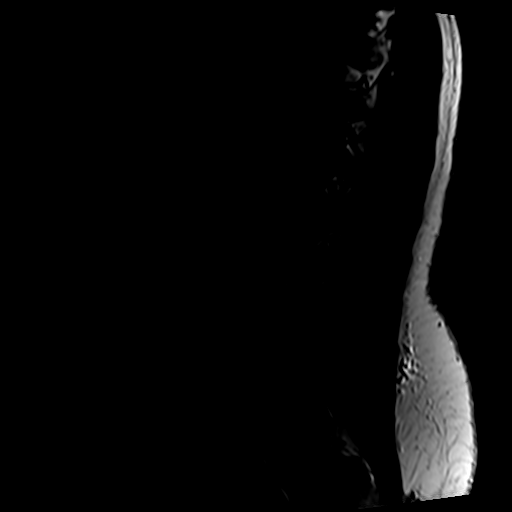
[im 15/15]
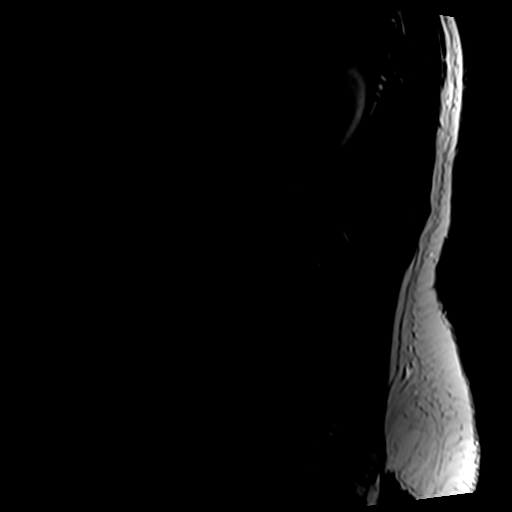

[Series 5: T1 · sagittal · 4.0mm · 0.53mm/px · 4 of 8 slices shown (1 of 2)]
[im 1/8]
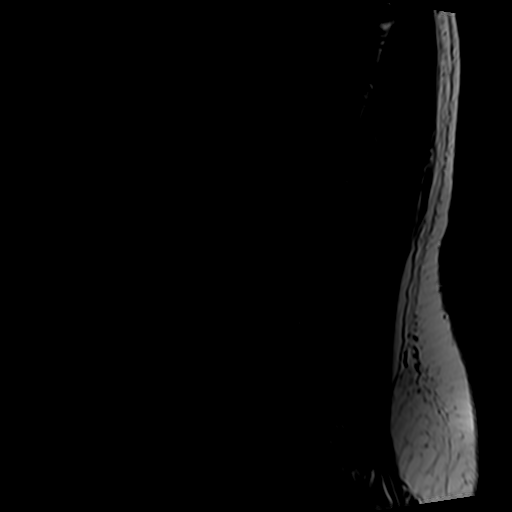
[im 3/8]
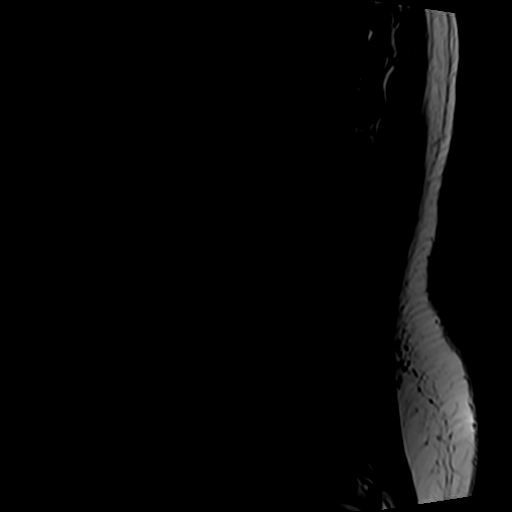
[im 5/8]
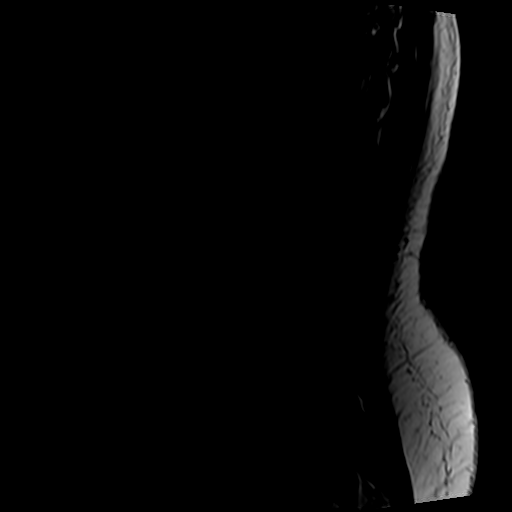
[im 8/8]
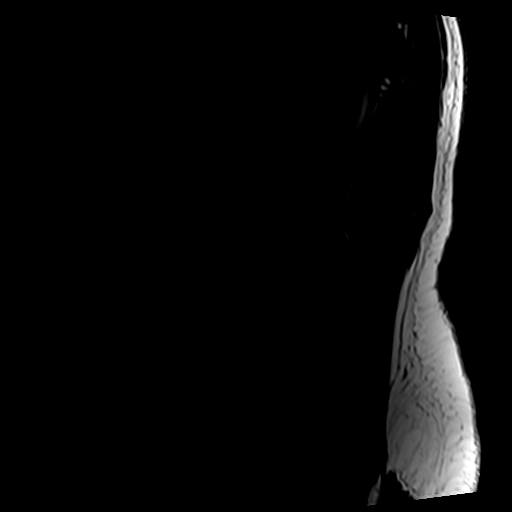

[Series 6: T2 · axial · 4.0mm · 0.70mm/px · z∈[-156,+38]mm · 10 of 35 slices shown (2 of 2)]
[im 3/35]
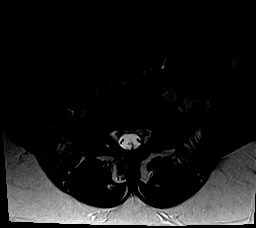
[im 5/35]
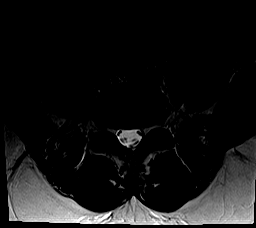
[im 7/35]
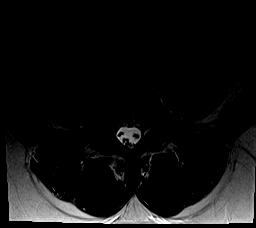
[im 12/35]
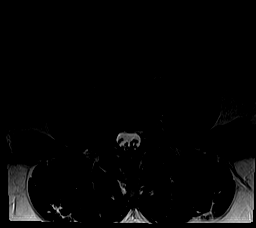
[im 16/35]
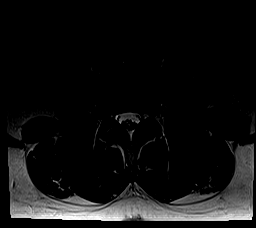
[im 19/35]
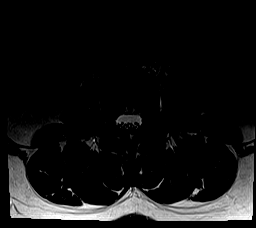
[im 21/35]
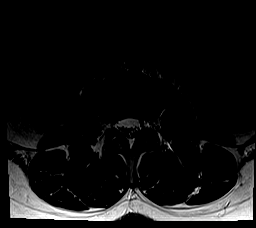
[im 25/35]
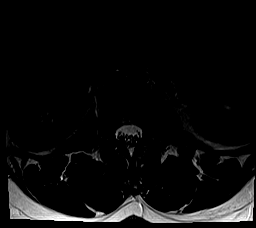
[im 30/35]
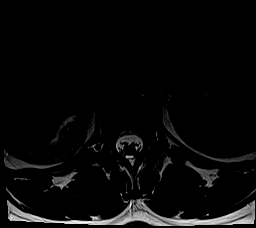
[im 35/35]
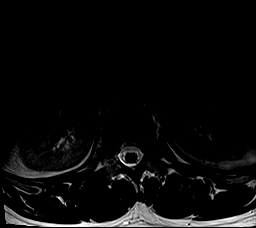

[Series 7: T1 · axial · 4.0mm · 0.35mm/px · z∈[-156,+12]mm · 7 of 35 slices shown (2 of 2)]
[im 3/35]
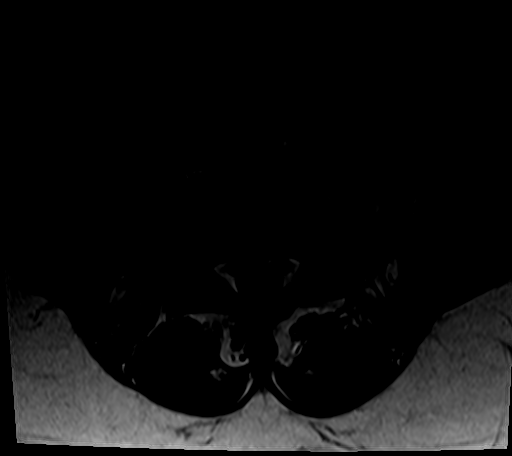
[im 5/35]
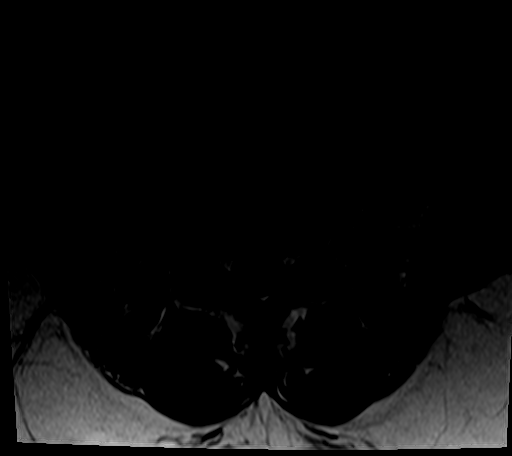
[im 7/35]
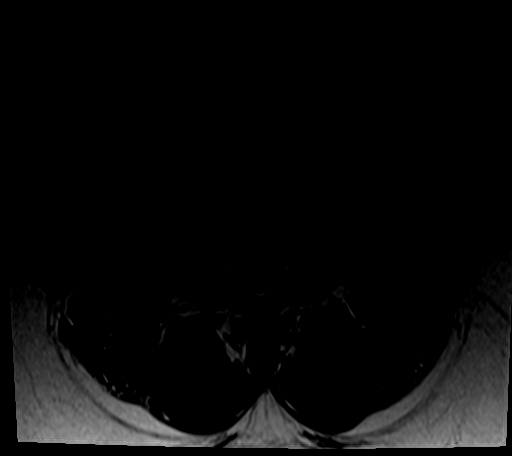
[im 12/35]
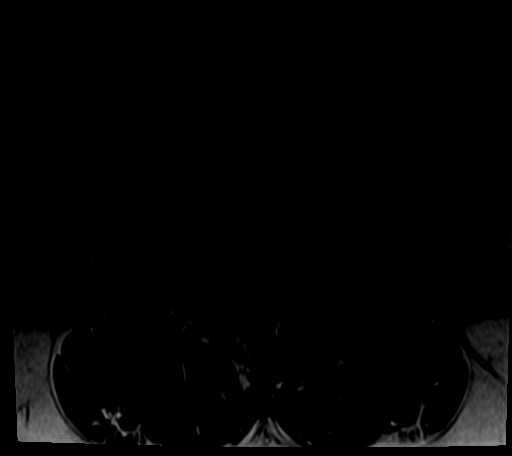
[im 16/35]
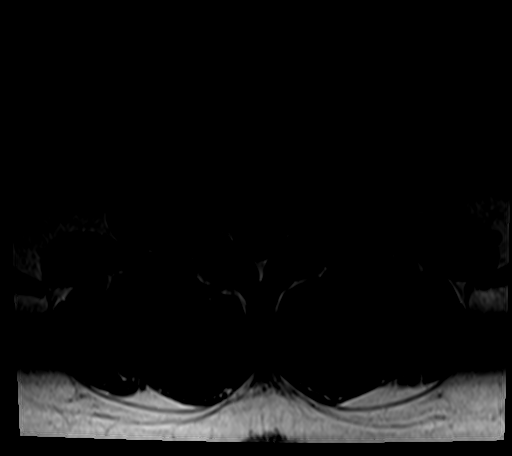
[im 19/35]
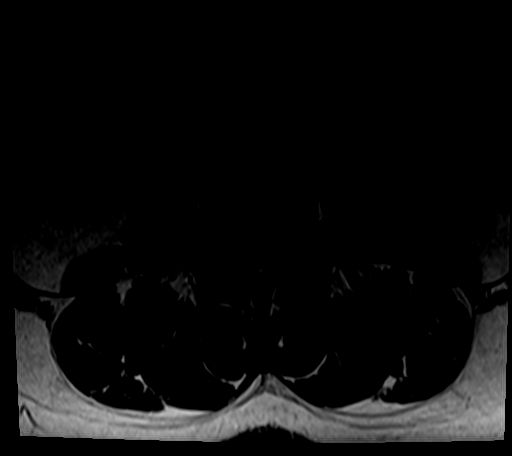
[im 30/35]
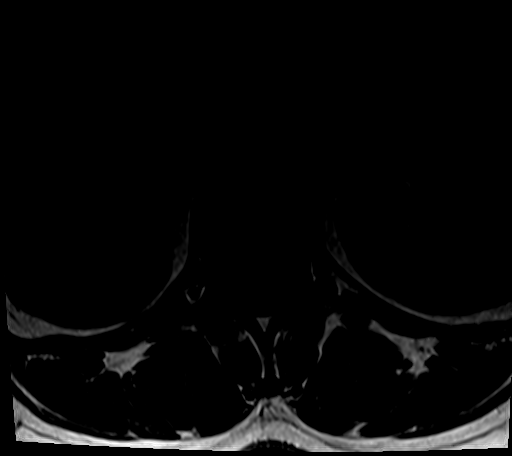

[27 of 48 positions shown; findings below may reference images not displayed]

FINDINGS: Segmentation:  5 lumbar type vertebral bodies.

Alignment:  Normal

Vertebrae:  Normal

Conus medullaris and cauda equina: Conus extends to the L2 level.
Conus and cauda equina appear normal.

Paraspinal and other soft tissues: Normal

Disc levels:

No disc level pathology. No evidence of disc degeneration, bulge or
herniation. No stenosis of the canal or foramina. No advanced facet
arthropathy. Mild/minimal facet degeneration at L4-5.
IMPRESSION: No likely significant finding. No disc pathology. No stenosis of the
canal or foramina. Mild/minimal facet degeneration at L4-5 which may
be subclinical or could possibly relate to mild low back pain.

## 2024-01-09 ENCOUNTER — Other Ambulatory Visit: Payer: Self-pay | Admitting: Obstetrics and Gynecology

## 2024-01-09 DIAGNOSIS — N6489 Other specified disorders of breast: Secondary | ICD-10-CM

## 2024-01-29 ENCOUNTER — Ambulatory Visit
Admission: RE | Admit: 2024-01-29 | Discharge: 2024-01-29 | Disposition: A | Discharge status: Home / Self Care | Source: Ambulatory Visit | Attending: Obstetrics and Gynecology | Admitting: Obstetrics and Gynecology

## 2024-01-29 DIAGNOSIS — N6489 Other specified disorders of breast: Secondary | ICD-10-CM

## 2024-02-11 ENCOUNTER — Ambulatory Visit: Admitting: Obstetrics and Gynecology

## 2024-02-11 ENCOUNTER — Encounter: Payer: Self-pay | Admitting: Obstetrics and Gynecology

## 2024-02-11 VITALS — BP 142/82 | HR 74 | Wt 158.0 lb

## 2024-02-11 DIAGNOSIS — N939 Abnormal uterine and vaginal bleeding, unspecified: Secondary | ICD-10-CM

## 2024-02-11 DIAGNOSIS — N946 Dysmenorrhea, unspecified: Secondary | ICD-10-CM
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
## 2024-02-18 ENCOUNTER — Encounter: Payer: Self-pay | Admitting: Obstetrics and Gynecology

## 2024-04-06 DIAGNOSIS — Z0289 Encounter for other administrative examinations: Secondary | ICD-10-CM

## 2024-04-19 ENCOUNTER — Encounter (HOSPITAL_COMMUNITY): Payer: Self-pay | Admitting: Obstetrics and Gynecology

## 2024-04-20 DIAGNOSIS — N939 Abnormal uterine and vaginal bleeding, unspecified: Secondary | ICD-10-CM | POA: Insufficient documentation

## 2024-04-21 ENCOUNTER — Encounter (HOSPITAL_COMMUNITY): Payer: Self-pay | Admitting: *Deleted

## 2024-04-21 DIAGNOSIS — N390 Urinary tract infection, site not specified: Secondary | ICD-10-CM

## 2024-04-21 HISTORY — DX: Urinary tract infection, site not specified: N39.0

## 2024-04-21 NOTE — Pre-Procedure Instructions (Signed)
 Surgical Instructions  Your procedure is scheduled on :   Tuesday,  04-27-2024 Report to [facility] Main Entrance A at 5:30 AM, then check in the Admitting office. Any questions or running late day of surgery :  call [phone]  Questions prior to your surgery day:  call [phone], Monday -- Friday 8am - 4pm. If you experience any cold or flu symptoms such as cough, fever, chills, shortness of breath, etc. between now and you scheduled surgery, please notify your surgeon office.   Remember: Do Not eat any food and Do Not drink any liquids after midnight the night before surgery.    This includes No water,  candy,  gum, and mints.  Take these medicines the morning of surgery with A SIPS OF WATER:   Levothyroxine (synthroid)   May take these medicines IF NEEDED:   Excedrine aspirine free   One week prior to surgery, STOP taking any Aspirin (unless otherwise instructed by your surgeon) Aleve, Naproxen, ibuprofen, Motrin, Advil, Goody's, BC's, all herbal medications/ supplements, fish oil, and non-prescription vitamins.  Do NOT Smoke (tobacco/ vaping) and Do Not drink alcohol for 24 hours prior to your procedure.  For those patients that use a CPAP.  Please bring your CPAP/ mask/ tubing with them day of surgery . Anesthesia may ask recovery room nurse to use and if you stay the night you be asked to use it.  You will be asked to removed any contacts, glasses, piercing's, hearing aid's, dentures/ partials prior to surgery.  Please bring cases/ container/ solution/ etc., for them day of surgery.   Patients discharged the day of surgery will NOT be allowed to drive home.  You must have responsible driver and caregiver to stay at home with you the next 24 hours.  SURGICAL WAITING ROOM VISITATION Patients may have no more than 2 support people in the waiting area - if more than 2 , these visitors may rotate.  Pre-op nurse will coordinate an appropriate time for 1 Adult support person, who  may not rotate, to accompany patient in pre-op.  Aware some patients may have certain circumstances, speak to pre-op nurse day of surgery.  Children under the age 38 must have an adult with them who is not the patient and must remain in the main waiting area with an adult.  If the patient needs to stay at the hospital during part of their recovery, the visitor guidelines for inpatient rooms apply.  Please refer to the [facility] website for the visitor guidelines for any additional information.  If you received a COVID test during your pre-op visit it is requested that you wear a mask when out in public, stay away from anyone that may not be feeling well and notify your surgeon if you develop symptoms.  If you have been in contact with anyone that has tested positive in the past 10 days notify your surgeon.     [place] - Preparing for Surgery  Before surgery, you can play an important role. Because skin is not sterile, it needs to be as free of germs as possible. You can reduce the number of germs on your skin by washing with CHG (chlorhexidine gluconate) soap before surgery. CHG is an antiseptic cleaner which kills germs and bonds with the skin to continue killing germs even after washing. Oral hygiene is also important in reducing the risk of infection. Remember to brush your teeth with your regular toothpaste the morning of surgery.  Please DO NOT use  if you have an allergy to CHG or antibacterial soaps. If your skin becomes reddened/irritated stop using the CHG and inform your Pre-op nurse day of surgery.  DO NOT shave (including legs and genital area) for at least 48 hours prior to your CHG shower.   Please follow these instructions carefully:  Shower with CHG soap the night before surgery. If you choose to wash your hair, wash your hair first as usual with your normal shampoo. After you shampoo, rinse your hair and body thoroughly to remove the shampoo. Use CHG as you would any  other liquid soap. You can apply CHG directly to the skin and wash gently with a clean washcloth or shower sponge. Apply the CHG soap to your body ONLY FROM THE NECK DOWN. Do not use on open wounds or open sores. Avoid contact with your eyes, ears, mouth, and genitals (private parts). Wash genitals (private parts) with your normal soap. Wash thoroughly, paying special attention to the area where your surgery will be performed. Thoroughly rinse your body with warm water from the neck down. DO NOT shower/wash with your normal soap after using and rinsing off the CHG soap. DO NOT use lotions, oils, etc., after showering with CHG. Pat yourself dry with a clean towel. Wear clean pajamas. Place clean sheets on your bed the night of your CHG shower and do not sleep with pets.  Day of Surgery  DO NOT Apply any lotions,  powder,  oils,  deodorants (may use underarm deodorant),  cologne/  perfumes  or makeup Do Not wear jewelry /  piercing's/  metal/  permanent jewelry must be removed prior to arrival day of surgery. (No plastic piercing) Do Not wear nail polish,  gel polish,  artificial nails, or any other type of covering on natural finger nails (toe nails are okay) Remember to brush your teeth and rinse mouth out. Put on clean / comfortable clothes. [facility] is not responsible for valuables/ personal belongings

## 2024-04-21 NOTE — Progress Notes (Signed)
 Spoke w/ via phone for pre-op interview--- pt Lab needs dos----    upt (per anes)     Lab results------ lab appt 04-26-2024 @ 0930 getting CBC (per anes)/  T&S surgeon COVID test -----patient states asymptomatic no test needed Arrive at -------  0530 on 04-27-2024 NPO after MN w/ exception sips of water w/ meds Pre-Surgery Ensure or G2: n/a  Med rec completed Medications to take morning of surgery ----- synthroid Diabetic medication ----- n/a  GLP1 agonist last dose: n/a GLP1 instructions:  Patient instructed no nail polish to be worn day of surgery Patient instructed to bring photo id and insurance card day of surgery Patient aware to have Driver (ride ) / caregiver    for 24 hours after surgery - husband, biomedical engineer Patient Special Instructions ----- will pick up soap and written instructions at lab appt Pre-Op special Instructions ----- n/a  Patient verbalized understanding of instructions that were given at this phone interview. Patient denies chest pain, sob, fever, cough at the interview.

## 2024-04-26 ENCOUNTER — Telehealth: Payer: Self-pay | Admitting: Obstetrics and Gynecology

## 2024-04-26 ENCOUNTER — Encounter (HOSPITAL_COMMUNITY)
Admission: RE | Admit: 2024-04-26 | Discharge: 2024-04-26 | Disposition: A | Discharge status: Home / Self Care | Source: Ambulatory Visit | Attending: Obstetrics and Gynecology | Admitting: Obstetrics and Gynecology

## 2024-04-26 DIAGNOSIS — N946 Dysmenorrhea, unspecified: Secondary | ICD-10-CM

## 2024-04-26 DIAGNOSIS — N939 Abnormal uterine and vaginal bleeding, unspecified: Secondary | ICD-10-CM | POA: Insufficient documentation

## 2024-04-26 DIAGNOSIS — Z01812 Encounter for preprocedural laboratory examination: Secondary | ICD-10-CM | POA: Insufficient documentation

## 2024-04-26 DIAGNOSIS — Z01818 Encounter for other preprocedural examination: Secondary | ICD-10-CM

## 2024-04-26 LAB — TYPE AND SCREEN
ABO/RH(D): AB POS
Antibody Screen: NEGATIVE

## 2024-04-26 LAB — CBC
HCT: 31.7 % — ABNORMAL LOW (ref 36.0–46.0)
Hemoglobin: 10.5 g/dL — ABNORMAL LOW (ref 12.0–15.0)
MCH: 28.8 pg (ref 26.0–34.0)
MCHC: 33.1 g/dL (ref 30.0–36.0)
MCV: 86.8 fL (ref 80.0–100.0)
Platelets: 293 K/uL (ref 150–400)
RBC: 3.65 MIL/uL — ABNORMAL LOW (ref 3.87–5.11)
RDW: 15.9 % — ABNORMAL HIGH (ref 11.5–15.5)
WBC: 5.6 K/uL (ref 4.0–10.5)
nRBC: 0.5 % — ABNORMAL HIGH (ref 0.0–0.2)

## 2024-04-26 NOTE — Telephone Encounter (Addendum)
 Called [phone]. The case reference number is [id number]  Informed by practice manager that procedure had been denied and given information for peer to peer to review case.  Marjoun B. Rep spoken to re: reschedule peer to peer  10/16 This provider was on the phone with the representative for > 45 minutes, during which time the representative purports they were in communication with the medical director, whose name and specialty were not disclosed. They requested clinical documentation regarding endometrial sampling and imaging despite documentation demonstrating endometrial   Set up peer to peer for 5:30pm today and spoke with   Spoke with Dr. Mardy [phone]) - states that imaging required within the last year as well as endometrial biopsy given bleeding following ablation to rule out hyperplasia/malignancy or other causes of bleeding. States that it is within their policy that updated imaging be completed prior to proceeding with hysterectomy. No  [place]

## 2024-04-27 ENCOUNTER — Encounter (HOSPITAL_COMMUNITY): Payer: Self-pay | Admitting: Obstetrics and Gynecology

## 2024-04-27 ENCOUNTER — Telehealth: Payer: Self-pay | Admitting: Obstetrics and Gynecology

## 2024-04-27 ENCOUNTER — Encounter (HOSPITAL_BASED_OUTPATIENT_CLINIC_OR_DEPARTMENT_OTHER): Payer: Self-pay | Admitting: Obstetrics & Gynecology

## 2024-04-27 ENCOUNTER — Encounter (HOSPITAL_COMMUNITY): Admitting: Anesthesiology

## 2024-04-27 ENCOUNTER — Ambulatory Visit (HOSPITAL_COMMUNITY): Admitting: Anesthesiology

## 2024-04-27 ENCOUNTER — Observation Stay (HOSPITAL_COMMUNITY)
Admission: RE | Admit: 2024-04-27 | Discharge: 2024-04-27 | Disposition: A | Source: Ambulatory Visit | Attending: Obstetrics and Gynecology | Admitting: Obstetrics and Gynecology

## 2024-04-27 ENCOUNTER — Ambulatory Visit (HOSPITAL_COMMUNITY)
Admission: RE | Admit: 2024-04-27 | Discharge: 2024-04-28 | Disposition: A | Discharge status: Home / Self Care | Attending: Obstetrics and Gynecology | Admitting: Obstetrics and Gynecology

## 2024-04-27 ENCOUNTER — Other Ambulatory Visit: Payer: Self-pay

## 2024-04-27 ENCOUNTER — Encounter (HOSPITAL_COMMUNITY)
Admission: RE | Disposition: A | Discharge status: Home / Self Care | Payer: Self-pay | Source: Home / Self Care | Attending: Obstetrics and Gynecology

## 2024-04-27 ENCOUNTER — Other Ambulatory Visit (HOSPITAL_COMMUNITY): Payer: Self-pay

## 2024-04-27 ENCOUNTER — Other Ambulatory Visit

## 2024-04-27 DIAGNOSIS — N8003 Adenomyosis of the uterus: Secondary | ICD-10-CM

## 2024-04-27 DIAGNOSIS — Z8673 Personal history of transient ischemic attack (TIA), and cerebral infarction without residual deficits: Secondary | ICD-10-CM | POA: Insufficient documentation

## 2024-04-27 DIAGNOSIS — N736 Female pelvic peritoneal adhesions (postinfective): Secondary | ICD-10-CM | POA: Insufficient documentation

## 2024-04-27 DIAGNOSIS — D219 Benign neoplasm of connective and other soft tissue, unspecified: Secondary | ICD-10-CM

## 2024-04-27 DIAGNOSIS — M8580 Other specified disorders of bone density and structure, unspecified site: Secondary | ICD-10-CM | POA: Insufficient documentation

## 2024-04-27 DIAGNOSIS — N8000 Endometriosis of the uterus, unspecified: Secondary | ICD-10-CM | POA: Diagnosis not present

## 2024-04-27 DIAGNOSIS — I10 Essential (primary) hypertension: Secondary | ICD-10-CM | POA: Diagnosis not present

## 2024-04-27 DIAGNOSIS — N809 Endometriosis, unspecified: Secondary | ICD-10-CM | POA: Diagnosis not present

## 2024-04-27 DIAGNOSIS — N888 Other specified noninflammatory disorders of cervix uteri: Secondary | ICD-10-CM | POA: Diagnosis not present

## 2024-04-27 DIAGNOSIS — D259 Leiomyoma of uterus, unspecified: Secondary | ICD-10-CM | POA: Diagnosis not present

## 2024-04-27 DIAGNOSIS — N946 Dysmenorrhea, unspecified: Secondary | ICD-10-CM

## 2024-04-27 DIAGNOSIS — R102 Pelvic and perineal pain unspecified side: Secondary | ICD-10-CM | POA: Diagnosis present

## 2024-04-27 DIAGNOSIS — Z01818 Encounter for other preprocedural examination: Secondary | ICD-10-CM

## 2024-04-27 DIAGNOSIS — N938 Other specified abnormal uterine and vaginal bleeding: Secondary | ICD-10-CM

## 2024-04-27 DIAGNOSIS — K660 Peritoneal adhesions (postprocedural) (postinfection): Secondary | ICD-10-CM | POA: Insufficient documentation

## 2024-04-27 DIAGNOSIS — G629 Polyneuropathy, unspecified: Secondary | ICD-10-CM | POA: Insufficient documentation

## 2024-04-27 DIAGNOSIS — Z8249 Family history of ischemic heart disease and other diseases of the circulatory system: Secondary | ICD-10-CM | POA: Diagnosis not present

## 2024-04-27 DIAGNOSIS — D251 Intramural leiomyoma of uterus: Secondary | ICD-10-CM | POA: Insufficient documentation

## 2024-04-27 DIAGNOSIS — N939 Abnormal uterine and vaginal bleeding, unspecified: Secondary | ICD-10-CM

## 2024-04-27 DIAGNOSIS — M1990 Unspecified osteoarthritis, unspecified site: Secondary | ICD-10-CM | POA: Diagnosis not present

## 2024-04-27 DIAGNOSIS — E039 Hypothyroidism, unspecified: Secondary | ICD-10-CM | POA: Diagnosis not present

## 2024-04-27 DIAGNOSIS — D869 Sarcoidosis, unspecified: Secondary | ICD-10-CM | POA: Diagnosis not present

## 2024-04-27 HISTORY — DX: Personal history of other diseases of the musculoskeletal system and connective tissue: Z87.39

## 2024-04-27 HISTORY — DX: Dysmenorrhea, unspecified: N94.6

## 2024-04-27 HISTORY — DX: Hypothyroidism, unspecified: E03.9

## 2024-04-27 HISTORY — DX: Iron deficiency anemia, unspecified: D50.9

## 2024-04-27 HISTORY — DX: Migraine, unspecified, not intractable, without status migrainosus: G43.909

## 2024-04-27 HISTORY — DX: Nocturia: R35.1

## 2024-04-27 HISTORY — DX: Family history of other specified conditions: Z84.89

## 2024-04-27 HISTORY — PX: CYSTOSCOPY: SHX5120

## 2024-04-27 HISTORY — PX: ROBOTIC ASSISTED TOTAL HYSTERECTOMY: SHX6085

## 2024-04-27 HISTORY — PX: ROBOTIC ASSISTED LAPAROSCOPIC LYSIS OF ADHESION: SHX6080

## 2024-04-27 HISTORY — DX: Abnormal uterine and vaginal bleeding, unspecified: N93.9

## 2024-04-27 HISTORY — DX: Sarcoidosis of other sites: D86.89

## 2024-04-27 HISTORY — DX: Presence of spectacles and contact lenses: Z97.3

## 2024-04-27 LAB — POCT PREGNANCY, URINE: Preg Test, Ur: NEGATIVE

## 2024-04-27 SURGERY — HYSTERECTOMY, TOTAL, ROBOT-ASSISTED
Anesthesia: General | Site: Pelvis

## 2024-04-27 MED ORDER — POLYETHYLENE GLYCOL 3350 17 G PO PACK: 17.0000 g | PACK | Freq: Every day | ORAL | Status: DC | PRN

## 2024-04-27 MED ORDER — LACTATED RINGERS IV SOLN: INTRAVENOUS | Status: DC

## 2024-04-27 MED ORDER — LIDOCAINE 2% (20 MG/ML) 5 ML SYRINGE
INTRAMUSCULAR | Status: DC | PRN
Start: 2024-04-27 — End: 2024-04-27
  Administered 2024-04-27: 100 mg via INTRAVENOUS

## 2024-04-27 MED ORDER — SIMETHICONE 80 MG PO CHEW: 80.0000 mg | CHEWABLE_TABLET | Freq: Four times a day (QID) | ORAL | Status: DC | PRN

## 2024-04-27 MED ORDER — SCOPOLAMINE 1 MG/3DAYS TD PT72
MEDICATED_PATCH | TRANSDERMAL | Status: AC
  Filled 2024-04-27: qty 1

## 2024-04-27 MED ORDER — OXYCODONE HCL 5 MG PO TABS
5.0000 mg | ORAL_TABLET | ORAL | 0 refills | Status: AC | PRN
Start: 2024-04-27 — End: ?
  Filled 2024-04-27: qty 16, 3d supply, fill #0

## 2024-04-27 MED ORDER — KETOROLAC TROMETHAMINE 30 MG/ML IJ SOLN
INTRAMUSCULAR | Status: DC | PRN
  Administered 2024-04-27: 30 mg via INTRAVENOUS

## 2024-04-27 MED ORDER — LEVOTHYROXINE SODIUM 25 MCG PO TABS
50.0000 ug | ORAL_TABLET | Freq: Every day | ORAL | Status: DC
  Administered 2024-04-28: 50 ug via ORAL
  Filled 2024-04-27: qty 2

## 2024-04-27 MED ORDER — ACETAMINOPHEN 500 MG PO TABS
ORAL_TABLET | ORAL | Status: AC
  Filled 2024-04-27: qty 2

## 2024-04-27 MED ORDER — ACETAMINOPHEN 500 MG PO TABS
1000.0000 mg | ORAL_TABLET | ORAL | Status: AC
  Administered 2024-04-27: 1000 mg via ORAL

## 2024-04-27 MED ORDER — BUPIVACAINE LIPOSOME 1.3 % IJ SUSP
INTRAMUSCULAR | Status: DC | PRN
  Administered 2024-04-27: 40 mL

## 2024-04-27 MED ORDER — FENTANYL CITRATE (PF) 250 MCG/5ML IJ SOLN
INTRAMUSCULAR | Status: AC
  Filled 2024-04-27: qty 5

## 2024-04-27 MED ORDER — METRONIDAZOLE 500 MG/100ML IV SOLN
INTRAVENOUS | Status: AC
  Filled 2024-04-27: qty 100

## 2024-04-27 MED ORDER — IBUPROFEN 600 MG PO TABS: 600.0000 mg | ORAL_TABLET | Freq: Four times a day (QID) | ORAL | Status: DC

## 2024-04-27 MED ORDER — SUGAMMADEX SODIUM 200 MG/2ML IV SOLN
INTRAVENOUS | Status: DC | PRN
  Administered 2024-04-27: 300 mg via INTRAVENOUS

## 2024-04-27 MED ORDER — IBUPROFEN 800 MG PO TABS
800.0000 mg | ORAL_TABLET | Freq: Three times a day (TID) | ORAL | 1 refills | Status: AC | PRN
  Filled 2024-04-27: qty 90, 30d supply, fill #0

## 2024-04-27 MED ORDER — BUPIVACAINE LIPOSOME 1.3 % IJ SUSP
INTRAMUSCULAR | Status: AC
  Filled 2024-04-27: qty 20

## 2024-04-27 MED ORDER — ONDANSETRON HCL 4 MG PO TABS: 4.0000 mg | ORAL_TABLET | Freq: Four times a day (QID) | ORAL | Status: DC | PRN

## 2024-04-27 MED ORDER — PROPOFOL 10 MG/ML IV BOLUS
INTRAVENOUS | Status: DC | PRN
  Administered 2024-04-27: 40 mg via INTRAVENOUS
  Administered 2024-04-27: 160 mg via INTRAVENOUS

## 2024-04-27 MED ORDER — SODIUM CHLORIDE 0.9 % IR SOLN
Status: DC | PRN
  Administered 2024-04-27 (×2): 1000 mL

## 2024-04-27 MED ORDER — HYDROMORPHONE HCL 1 MG/ML IJ SOLN: 0.2000 mg | INTRAMUSCULAR | Status: DC | PRN

## 2024-04-27 MED ORDER — ACETAMINOPHEN 500 MG PO TABS
1000.0000 mg | ORAL_TABLET | Freq: Four times a day (QID) | ORAL | Status: DC
  Administered 2024-04-27 – 2024-04-28 (×3): 1000 mg via ORAL
  Filled 2024-04-27 (×3): qty 2

## 2024-04-27 MED ORDER — ONDANSETRON HCL 4 MG/2ML IJ SOLN
INTRAMUSCULAR | Status: AC
  Filled 2024-04-27: qty 2

## 2024-04-27 MED ORDER — POVIDONE-IODINE 10 % EX SWAB
2.0000 | Freq: Once | CUTANEOUS | Status: AC
  Administered 2024-04-27: 2 via TOPICAL

## 2024-04-27 MED ORDER — PROPOFOL 10 MG/ML IV BOLUS
INTRAVENOUS | Status: AC
  Filled 2024-04-27: qty 20

## 2024-04-27 MED ORDER — CEFAZOLIN SODIUM-DEXTROSE 2-4 GM/100ML-% IV SOLN
INTRAVENOUS | Status: AC
  Filled 2024-04-27: qty 100

## 2024-04-27 MED ORDER — CHLORHEXIDINE GLUCONATE 0.12 % MT SOLN
OROMUCOSAL | Status: AC
  Filled 2024-04-27: qty 15

## 2024-04-27 MED ORDER — MIDAZOLAM HCL 2 MG/2ML IJ SOLN
INTRAMUSCULAR | Status: AC
  Filled 2024-04-27: qty 2

## 2024-04-27 MED ORDER — ROCURONIUM BROMIDE 10 MG/ML (PF) SYRINGE
PREFILLED_SYRINGE | INTRAVENOUS | Status: AC
  Filled 2024-04-27: qty 10

## 2024-04-27 MED ORDER — ORAL CARE MOUTH RINSE: 15.0000 mL | Freq: Once | OROMUCOSAL | Status: AC

## 2024-04-27 MED ORDER — BUPIVACAINE HCL (PF) 0.5 % IJ SOLN
INTRAMUSCULAR | Status: AC
  Filled 2024-04-27: qty 30

## 2024-04-27 MED ORDER — FLUORESCEIN SODIUM 10 % IV SOLN
INTRAVENOUS | Status: DC | PRN
  Administered 2024-04-27: .25 mL via INTRAVENOUS

## 2024-04-27 MED ORDER — OXYCODONE HCL 5 MG PO TABS
5.0000 mg | ORAL_TABLET | ORAL | Status: DC | PRN
  Administered 2024-04-28: 10 mg via ORAL
  Filled 2024-04-27: qty 2

## 2024-04-27 MED ORDER — KETOROLAC TROMETHAMINE 30 MG/ML IJ SOLN
INTRAMUSCULAR | Status: AC
  Filled 2024-04-27: qty 1

## 2024-04-27 MED ORDER — ONDANSETRON HCL 4 MG/2ML IJ SOLN: 4.0000 mg | Freq: Four times a day (QID) | INTRAMUSCULAR | Status: DC | PRN

## 2024-04-27 MED ORDER — FENTANYL CITRATE (PF) 100 MCG/2ML IJ SOLN
INTRAMUSCULAR | Status: AC
  Filled 2024-04-27: qty 2

## 2024-04-27 MED ORDER — POLYETHYLENE GLYCOL 3350 17 GM/SCOOP PO POWD
17.0000 g | Freq: Every day | ORAL | 0 refills | Status: AC
  Filled 2024-04-27: qty 476, 28d supply, fill #0

## 2024-04-27 MED ORDER — DOCUSATE SODIUM 100 MG PO CAPS
100.0000 mg | ORAL_CAPSULE | Freq: Two times a day (BID) | ORAL | Status: DC
  Administered 2024-04-27 – 2024-04-28 (×2): 100 mg via ORAL
  Filled 2024-04-27 (×2): qty 1

## 2024-04-27 MED ORDER — METRONIDAZOLE 500 MG/100ML IV SOLN
500.0000 mg | INTRAVENOUS | Status: AC
  Administered 2024-04-27: 500 mg via INTRAVENOUS

## 2024-04-27 MED ORDER — CEFAZOLIN SODIUM-DEXTROSE 2-4 GM/100ML-% IV SOLN
2.0000 g | INTRAVENOUS | Status: AC
  Administered 2024-04-27: 2 g via INTRAVENOUS

## 2024-04-27 MED ORDER — DEXAMETHASONE SOD PHOSPHATE PF 10 MG/ML IJ SOLN
INTRAMUSCULAR | Status: DC | PRN
  Administered 2024-04-27: 10 mg via INTRAVENOUS

## 2024-04-27 MED ORDER — BUPIVACAINE HCL (PF) 0.5 % IJ SOLN
INTRAMUSCULAR | Status: DC | PRN
  Administered 2024-04-27: 10 mL

## 2024-04-27 MED ORDER — ROCURONIUM BROMIDE 10 MG/ML (PF) SYRINGE
PREFILLED_SYRINGE | INTRAVENOUS | Status: AC
Start: 2024-04-27 — End: 2024-04-27
  Filled 2024-04-27: qty 10

## 2024-04-27 MED ORDER — MIDAZOLAM HCL (PF) 2 MG/2ML IJ SOLN
INTRAMUSCULAR | Status: DC | PRN
  Administered 2024-04-27: 2 mg via INTRAVENOUS

## 2024-04-27 MED ORDER — FLUORESCEIN SODIUM 10 % IV SOLN
INTRAVENOUS | Status: AC
  Filled 2024-04-27: qty 5

## 2024-04-27 MED ORDER — HEMOSTATIC AGENTS (NO CHARGE) OPTIME
TOPICAL | Status: DC | PRN
  Administered 2024-04-27: 1 via TOPICAL

## 2024-04-27 MED ORDER — ONDANSETRON HCL 4 MG/2ML IJ SOLN
INTRAMUSCULAR | Status: DC | PRN
  Administered 2024-04-27: 4 mg via INTRAVENOUS

## 2024-04-27 MED ORDER — FENTANYL CITRATE (PF) 250 MCG/5ML IJ SOLN
INTRAMUSCULAR | Status: DC | PRN
  Administered 2024-04-27: 50 ug via INTRAVENOUS
  Administered 2024-04-27: 25 ug via INTRAVENOUS
  Administered 2024-04-27 (×3): 50 ug via INTRAVENOUS
  Administered 2024-04-27 (×2): 25 ug via INTRAVENOUS
  Administered 2024-04-27 (×2): 50 ug via INTRAVENOUS
  Administered 2024-04-27: 25 ug via INTRAVENOUS
  Administered 2024-04-27: 50 ug via INTRAVENOUS

## 2024-04-27 MED ORDER — 0.9 % SODIUM CHLORIDE (POUR BTL) OPTIME
TOPICAL | Status: DC | PRN
  Administered 2024-04-27: 1000 mL

## 2024-04-27 MED ORDER — KETOROLAC TROMETHAMINE 30 MG/ML IJ SOLN
30.0000 mg | Freq: Four times a day (QID) | INTRAMUSCULAR | Status: AC
  Administered 2024-04-27 – 2024-04-28 (×3): 30 mg via INTRAVENOUS
  Filled 2024-04-27 (×3): qty 1

## 2024-04-27 MED ORDER — SCOPOLAMINE 1 MG/3DAYS TD PT72
1.0000 | MEDICATED_PATCH | TRANSDERMAL | Status: DC
  Administered 2024-04-27: 1 mg via TRANSDERMAL

## 2024-04-27 MED ORDER — CHLORHEXIDINE GLUCONATE 0.12 % MT SOLN
15.0000 mL | Freq: Once | OROMUCOSAL | Status: AC
  Administered 2024-04-27: 15 mL via OROMUCOSAL

## 2024-04-27 MED ORDER — ROCURONIUM BROMIDE 10 MG/ML (PF) SYRINGE
PREFILLED_SYRINGE | INTRAVENOUS | Status: DC | PRN
Start: 2024-04-27 — End: 2024-04-27
  Administered 2024-04-27 (×3): 20 mg via INTRAVENOUS
  Administered 2024-04-27: 10 mg via INTRAVENOUS
  Administered 2024-04-27: 60 mg via INTRAVENOUS

## 2024-04-27 SURGICAL SUPPLY — 59 items
BARRIER ADHS 3X4 INTERCEED (GAUZE/BANDAGES/DRESSINGS) IMPLANT
CHLORAPREP W/TINT 26 (MISCELLANEOUS) ×3 IMPLANT
COVER BACK TABLE 60X90IN (DRAPES) ×3 IMPLANT
COVER TIP SHEARS 8 DVNC (MISCELLANEOUS) ×3 IMPLANT
DEFOGGER SCOPE WARM SEASHARP (MISCELLANEOUS) ×3 IMPLANT
DERMABOND ADVANCED .7 DNX12 (GAUZE/BANDAGES/DRESSINGS) ×3 IMPLANT
DRAPE ARM DVNC X/XI (DISPOSABLE) ×12 IMPLANT
DRAPE COLUMN DVNC XI (DISPOSABLE) ×3 IMPLANT
DRAPE SURG IRRIG POUCH 19X23 (DRAPES) ×3 IMPLANT
DRAPE UTILITY XL STRL (DRAPES) ×3 IMPLANT
DRIVER NDL MEGA SUTCUT DVNCXI (INSTRUMENTS) ×3 IMPLANT
DRIVER NDLE MEGA SUTCUT DVNCXI (INSTRUMENTS) ×3 IMPLANT
ELECTRODE REM PT RTRN 9FT ADLT (ELECTROSURGICAL) ×3 IMPLANT
FORCEPS BPLR FENES DVNC XI (FORCEP) ×3 IMPLANT
FORCEPS PROGRASP DVNC XI (FORCEP) ×3 IMPLANT
GAUZE 4X4 16PLY ~~LOC~~+RFID DBL (SPONGE) IMPLANT
GLOVE BIOGEL PI IND STRL 7.0 (GLOVE) ×9 IMPLANT
GLOVE ECLIPSE 7.0 STRL STRAW (GLOVE) ×9 IMPLANT
GOWN STRL REUS W/ TWL XL LVL3 (GOWN DISPOSABLE) ×9 IMPLANT
HIBICLENS CHG 4% 4OZ BTL (MISCELLANEOUS) ×6 IMPLANT
HOLDER FOLEY CATH W/STRAP (MISCELLANEOUS) ×3 IMPLANT
IRRIGATION SUCT STRKRFLW 2 WTP (MISCELLANEOUS) ×3 IMPLANT
KIT PINK PAD W/HEAD ARM REST (MISCELLANEOUS) ×3 IMPLANT
KIT TURNOVER KIT B (KITS) ×3 IMPLANT
LEGGING LITHOTOMY PAIR STRL (DRAPES) ×3 IMPLANT
MANIFOLD NEPTUNE II (INSTRUMENTS) IMPLANT
NDL INSUFFLATION 14GA 120MM (NEEDLE) IMPLANT
NEEDLE INSUFFLATION 14GA 120MM (NEEDLE) IMPLANT
OBTURATOR OPTICALSTD 8 DVNC (TROCAR) ×3 IMPLANT
OCCLUDER COLPOPNEUMO (BALLOONS) IMPLANT
PACK ROBOT WH (CUSTOM PROCEDURE TRAY) ×3 IMPLANT
PAD OB MATERNITY 11 LF (PERSONAL CARE ITEMS) ×3 IMPLANT
PAD POSITIONING PINK XL (MISCELLANEOUS) ×3 IMPLANT
POWDER SURGICEL 3.0 GRAM (HEMOSTASIS) IMPLANT
RUMI II 3.0CM BLUE KOH-EFFICIE (DISPOSABLE) IMPLANT
RUMI II GYRUS 2.5CM BLUE (DISPOSABLE) IMPLANT
RUMI II GYRUS 3.5CM BLUE (DISPOSABLE) IMPLANT
RUMI II GYRUS 4.0CM BLUE (DISPOSABLE) IMPLANT
SCISSORS MNPLR CVD DVNC XI (INSTRUMENTS) ×3 IMPLANT
SEAL UNIV 5-12 XI (MISCELLANEOUS) ×12 IMPLANT
SEALER VESSEL EXT DVNC XI (MISCELLANEOUS) ×3 IMPLANT
SET CYSTO IRRIGATION (SET/KITS/TRAYS/PACK) ×3 IMPLANT
SET TRI-LUMEN FLTR TB AIRSEAL (TUBING) ×3 IMPLANT
SOLN 0.9% NACL POUR BTL 1000ML (IV SOLUTION) ×3 IMPLANT
SPIKE FLUID TRANSFER (MISCELLANEOUS) ×3 IMPLANT
SUT VIC AB 0 CT1 27XBRD ANBCTR (SUTURE) IMPLANT
SUT VIC AB 4-0 PS2 18 (SUTURE) ×6 IMPLANT
SUT VLOC 180 0 9IN GS21 (SUTURE) ×3 IMPLANT
SYSTEM BAG RETRIEVAL 10MM (BASKET) IMPLANT
TIP ENDOSCOPIC SURGICEL (TIP) IMPLANT
TIP RUMI ORANGE 6.7MMX12CM (TIP) IMPLANT
TIP UTERINE 5.1X6CM LAV DISP (MISCELLANEOUS) IMPLANT
TIP UTERINE 6.7X10CM GRN DISP (MISCELLANEOUS) IMPLANT
TIP UTERINE 6.7X6CM WHT DISP (MISCELLANEOUS) IMPLANT
TIP UTERINE 6.7X8CM BLUE DISP (MISCELLANEOUS) IMPLANT
TOWEL GREEN STERILE (TOWEL DISPOSABLE) ×3 IMPLANT
TRAY FOLEY W/BAG SLVR 14FR (SET/KITS/TRAYS/PACK) ×3 IMPLANT
TROCAR PORT AIRSEAL 8X120 (TROCAR) ×3 IMPLANT
UNDERPAD 30X36 HEAVY ABSORB (UNDERPADS AND DIAPERS) ×3 IMPLANT
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
## 2024-04-27 NOTE — Brief Op Note (Addendum)
 04/27/2024  4:24 PM  PATIENT:  Mindy Brown  48 y.o. female  PRE-OPERATIVE DIAGNOSIS:  Dysfunctional uterine bleeding Pelvic pain syndrome  POST-OPERATIVE DIAGNOSIS:  Dysfunctional uterine bleedingPelvic pain syndrome  PROCEDURE:  Procedure(s): HYSTERECTOMY, TOTAL, ROBOT-ASSISTED (N/A) CYSTOSCOPY (N/A) LYSIS, ADHESIONS, ROBOT-ASSISTED, LAPAROSCOPIC  SURGEON:  Surgeons and Role:    DEWAINE Jeralyn Crutch, MD - Primary  PHYSICIAN ASSISTANT: Jorene Moats, PA  ASSISTANTS: above   ANESTHESIA:   general, paracervical block, and  laparoscopic TAP block  EBL:  100 mL   BLOOD ADMINISTERED:none  DRAINS: none   LOCAL MEDICATIONS USED:  BUPIVICAINE  and OTHER exparel   SPECIMEN:  Source of Specimen:  uterus and cervix  DISPOSITION OF SPECIMEN:  PATHOLOGY  COUNTS:  YES  TOURNIQUET:  * No tourniquets in log *  DICTATION: .Note written in EPIC  PLAN OF CARE: extended reovery  PATIENT DISPOSITION:  PACU - hemodynamically stable.   Delay start of Pharmacological VTE agent (>24hrs) due to surgical blood loss or risk of bleeding: not applicable

## 2024-04-27 NOTE — Op Note (Signed)
 Mindy Brown PROCEDURE DATE: 04/27/2024  PREOPERATIVE DIAGNOSIS: dysmenorrhea, abnormal uterine bleeding  POSTOPERATIVE DIAGNOSIS: dysmenorrhea, abnormal uterine bleeding PROCEDURE:    robotic assisted total laparoscopic hysterectomy, lysis of adhesions, cystoscopy SURGEON: Carter Quarry, MD ASSISTANT:  Jorene Moats, PA    An experienced assistant was required given the standard of surgical care given the complexity of the case.  This assistant was needed for exposure, dissection, suctioning, retraction, instrument exchange, and for overall help during the procedure.  INDICATIONS: 48 y.o. [id number] with AUB and dysmenorrhea.  Risks of surgery were discussed with the patient including but not limited to: bleeding which may require transfusion; infection which may require antibiotics; injury to surrounding organs; need for additional procedures including laparotomy;  and other postoperative/anesthesia complications. Written informed consent was obtained.    FINDINGS:  Normal external genitalia, 10 wk size mobile uterus with Normal contours though globular and posterior cul de sac tacked up.  Laparoscopically: normal upper abdominal survey, enlarged uterus covered in vesicular endometriotic lesions and dense scar tissue posteriorly obliterating the cul de sac, bilateral ovaries adhered posteriorly, surgically absent fallopian tubes bilaterally, bilateral ureters visualized within the retroperitoneal space, vesicouterine adhesions anteriorly, appendix with adhesions to the peritoneum, and slightly denser than normal sigmoid adhesions to the pelvic side wall Cystoscopically: normal bladder wall without apparent injury, bilateral ureteral orifices, fluorescein-stained urine from bilateral ureteral orifices   ANESTHESIA: General, paracervical block, laparoscopic TAP block INTRAVENOUS FLUIDS:  800 ml of LR ESTIMATED BLOOD LOSS:  100 ml URINE OUTPUT: 400 ml SPECIMENS: uterus with  cervix COMPLICATIONS:  None immediate.   PROCEDURE: The risks, benefits, and alternatives of surgery were explained, understood, and accepted. Consents were signed. All questions were answered. She was taken to the operating room and general anesthesia was applied without complication. She was placed in the dorsal lithotomy position and her abdomen and vagina were prepped and draped after she had been carefully positioned on the table. A bimanual exam revealed a 10 week size uterus that was somewhat mobile. Her adnexa were not enlarged. A Foley catheter was placed and it drained clear throughout the case. A speculum was placed and the cervix visualized. The cervix was measured and the uterus was sounded to 7 cm. A Rumi uterine manipulator was placed without difficulty.  Gloves were changed and attention was turned to the abdomen. An 8mm incision was made in the LUQ and an optiview airseal trocar was introduced into the abdomen. The Entry was confirmed with low opening intraabdominal pressure and visualization and the abdomen was then insufflated. After good pneumoperitoneum was established, the abdomen was surveyed including the upper abdomen. Laparoscopic TAP block was then completed with 1:1 solution of 0.5% bupivicaine and exparel . She was placed in Trendelenburg position and ports were placed in appropriate positions on her abdomen to allow maximum exposure during the robotic case. Specifically, trocars were placed infraumbilically, in the LLQ, RLQ, and RUQ.  These were all placed under direct laparoscopic visualization after infiltration with local anesthetic. The robot was docked and I proceeded with a robotic portion of the case.  The pelvis was inspected and the uterus was found to have the above findings included obliterated posterior cul de sac and kissing ovaries.  The right round ligament on the right was cauterized and divided.  The leaves of the broad ligament were then separated, and the  anterior leaf was divided down to the level of the uterine manipulator ring.  The posterior leaf of the remaining broad ligament was divided until  the dense adhesion was encountered.  Attention was turned to the contralateral side in which the left round ligament was also cauterized and divided.  Raise of the broad ligament were then separated, and the anterior leaf separated down to the level of the vesicouterine adhesion.  At this time attention was turned posteriorly, where the ovaries were bluntly separated from each other.  When the IP and utero-ovarian ligaments could not be identified, attention was turned anteriorly.  At this time the bladder was backfilled with 200 cc of normal saline, and the vesicouterine adhesion taken down with blunt and sharp dissection.  The bladder flap was developed down to the pubocervical fascia.  The bilateral uterine arteries were then taken high on the uterus to decrease blood supply to the uterus.  At this time the right side pelvic sidewall was opened and the ureter was identified in the retroperitoneal space.  The ovary was bluntly dissected from the sidewall, and the utero-ovarian ligament identified.  Once the utero-ovarian ligament was identified, it was cauterized and divided and the ovary was removed posteriorly.  Attention was returned to the left side, where the peritoneum was similarly opened and the ureter identified in the retroperitoneal space.  The ureter was dissected out, and the uterine artery identified on the [facility] ring.  Attention returned posteriorly, where the adhesion was taken down with blunt and sharp dissection.  The left utero-ovarian ligament was identified, dissected out, cauterized and divided.  At this time attention was turned laterally, where the [facility] ring was identified and followed around posteriorly.  Bilateral uterine vessels were then divided and lateralized to the edge.  The anterior colpotomy was started and the blue outline of the Rumi was  identified.  The blue outline was traced circumferentially to ensure adequate distance from bilateral ureters and the rectum and sigmoid were out of the operative field.  The colpotomy was then completed circumferentially following the blue outline of the Rumi manipulator. All pedicles were hemostatic.  The uterus was removed from the vagina with the fallopian tube segments. The vaginal cuff was closed with v-lock suture. The pelvis was irrigated. The intraabdominal pressure was lowered assess hemostasis. Bleeding from the left IP ligament was addressed with vessel sealer and surgicel powder. Surgicel powder applied to the entirety of the pelvic operating field and there was appropriate hemostasis.  After determining adequate hemostasis, the robot was undocked. At this point I performed cystoscopy. The cystoscopy revealed fluorescein ejection from both ureters.   The skin from all of the other ports was closed with 4-0 vicryl. The patient was then extubated and taken to recovery in stable condition.   Sponge, lap and needle counts were correct x 2.    Carter Quarry, MD Minimally Invasive Gynecologic Surgery  Obstetrics and Gynecology, [facility] for [facility], [facility] 04/27/2024

## 2024-04-27 NOTE — Discharge Instructions (Signed)
 Post Op Hysterectomy Instructions Please read the instructions below. Refer to these instructions for the next few weeks. These instructions provide you with general information on caring for yourself after surgery. Your caregiver may also give you specific instructions. While your treatment has been planned according to the most current medical practices available, unavoidable problems sometimes happen. If you have any problems or questions after you leave, please call your caregiver.  HOME CARE INSTRUCTIONS Healing will take time. You will have discomfort, tenderness, swelling and bruising at the operative site for a couple of weeks. This is normal and will get better as time goes on.  Only take over-the-counter or prescription medicines for pain, discomfort or fever as directed by your caregiver.  Do not take aspirin. It can cause bleeding.  Do not drive when taking pain medication.  Follow your caregiver's advice regarding diet, exercise, lifting, driving and general activities.  Resume your usual diet as directed and allowed.  Get plenty of rest and sleep.  Do not douche, use tampons, or have sexual intercourse until your caregiver gives you permission. .  Take your temperature if you feel hot or flushed.  You may shower today when you get home.  No tub bath for one week.   Do not drink alcohol until you are not taking any narcotic pain medications.  Try to have someone home with you for a week or two to help with the household activities.   Be careful over the next two to three weeks with any activities at home that involve lifting, pushing, or pulling.  Listen to your body--if something feels uncomfortable to do, then don't do it. Make sure you and your family understands everything about your operation and recovery.  Walking up stairs is fine. Do not sign any legal documents until you feel normal again.  Keep all your follow-up appointments as recommended by your caregiver.   PLEASE CALL  THE OFFICE IF: There is swelling, redness or increasing pain in the wound area.  Pus is coming from the wound.  You notice a bad smell from the wound or surgical dressing.  You have pain, redness and swelling from the intravenous site.  The wound is breaking open (the edges are not staying together).   You develop pain or bleeding when you urinate.  You develop abnormal vaginal discharge.  You have any type of abnormal reaction or develop an allergy to your medication.  You need stronger pain medication for your pain   SEEK IMMEDIATE MEDICAL CARE: You develop a temperature of 100.5 or higher.  You develop abdominal pain.  You develop chest pain.  You develop shortness of breath.  You pass out.  You develop pain, swelling or redness of your leg.  You develop heavy vaginal bleeding with or without blood clots.   MEDICATIONS: Restart your regular medications BUT wait one week before restarting all vitamins and mineral supplements Use Motrin 800mg  every 8 hours for the next several days.   Take Tylenol 1000mg  every 8 hours for the next several days Use oxycodone 5 mg every 4-6 hours. Taking motrin and tylenol should help reduce how often you use oxycodone.  You may use an over the counter stool softener like Colace or Dulcolax to help with starting a bowel movement.  You can also use miralax (polyethylene glycol). Start the day after you go home.  Warm liquids, fluids, and ambulation help too.  If you have not had a bowel movement in four days, you need to  call the office.

## 2024-04-27 NOTE — Anesthesia Preprocedure Evaluation (Addendum)
 Anesthesia Evaluation  Patient identified by MRN, date of birth, ID band Patient awake    Reviewed: Allergy & Precautions, NPO status , Patient's Chart, lab work & pertinent test results  History of Anesthesia Complications (+) Family history of anesthesia reaction and history of anesthetic complications (mother with h/o prolonged emergence)  Airway Mallampati: II  TM Distance: >3 FB Neck ROM: Full   Comment: Previous grade I view with MAC 3, easy mask Dental  (+) Dental Advisory Given   Pulmonary neg shortness of breath, neg sleep apnea, neg COPD, neg recent URI Sarcoidosis (last flare ~8 months ago), not on steroids   Pulmonary exam normal breath sounds clear to auscultation       Cardiovascular hypertension, (-) angina (-) Past MI, (-) Cardiac Stents and (-) CABG + dysrhythmias (palpitations)  Rhythm:Regular Rate:Normal  TTE 08/28/2022: Impression  Left Ventricle: Systolic function is normal. EF: 60-65%.    Neuro/Psych  Headaches, neg Seizures PSYCHIATRIC DISORDERS  Depression     Neuromuscular disease (peripheral neuropathy, h/o transverse meylitis in 1999 (resolved)) CVA (read on MRI but neurologist did not think this was a stroke)    GI/Hepatic negative GI ROS, Neg liver ROS,,,  Endo/Other  neg diabetesHypothyroidism    Renal/GU negative Renal ROS     Musculoskeletal  (+) Arthritis ,  H/o steroid-induced osteopenia   Abdominal   Peds  Hematology  (+) Blood dyscrasia, anemia Lab Results      Component                Value               Date                      WBC                      5.6                 04/26/2024                HGB                      10.5 (L)            04/26/2024                HCT                      31.7 (L)            04/26/2024                MCV                      86.8                04/26/2024                PLT                      293                 04/26/2024               Anesthesia Other Findings   Reproductive/Obstetrics  Anesthesia Physical Anesthesia Plan  ASA: 3  Anesthesia Plan: General   Post-op Pain Management: Tylenol  PO (pre-op)*   Induction: Intravenous  PONV Risk Score and Plan: 3 and Ondansetron , Dexamethasone , Midazolam , Scopolamine patch - Pre-op and Treatment may vary due to age or medical condition  Airway Management Planned: Oral ETT  Additional Equipment:   Intra-op Plan:   Post-operative Plan: Extubation in OR  Informed Consent: I have reviewed the patients History and Physical, chart, labs and discussed the procedure including the risks, benefits and alternatives for the proposed anesthesia with the patient or authorized representative who has indicated his/her understanding and acceptance.     Dental advisory given  Plan Discussed with: CRNA and Anesthesiologist  Anesthesia Plan Comments: (Risks of general anesthesia discussed including, but not limited to, sore throat, hoarse voice, chipped/damaged teeth, injury to vocal cords, nausea and vomiting, allergic reactions, lung infection, heart attack, stroke, and death. All questions answered. )         Anesthesia Quick Evaluation

## 2024-04-27 NOTE — Transfer of Care (Signed)
 Immediate Anesthesia Transfer of Care Note  Patient: Mindy Brown  Procedure(s) Performed: HYSTERECTOMY, TOTAL, ROBOT-ASSISTED (Pelvis) CYSTOSCOPY (Bladder) LYSIS, ADHESIONS, ROBOT-ASSISTED, LAPAROSCOPIC (Pelvis)  Patient Location: PACU  Anesthesia Type:General  Level of Consciousness: awake, alert , and oriented  Airway & Oxygen Therapy: Patient Spontanous Breathing and Patient connected to nasal cannula oxygen  Post-op Assessment: Report given to RN, Post -op Vital signs reviewed and stable, and Patient moving all extremities X 4  Post vital signs: Reviewed and stable  Last Vitals:  Vitals Value Taken Time  BP 145/86 04/27/24 16:45  Temp 36.8 C 04/27/24 16:38  Pulse 70 04/27/24 16:48  Resp 11 04/27/24 16:48  SpO2 100 % 04/27/24 16:48  Vitals shown include unfiled device data.  Last Pain:  Vitals:   04/27/24 1645  TempSrc:   PainSc: Asleep      Patients Stated Pain Goal: 5 (04/27/24 1059)  Complications: No notable events documented.

## 2024-04-27 NOTE — Telephone Encounter (Signed)
 Confirmed ID x2. Reviewed case with patient and had patient scheduled for US . Given proximity to previously scheduled case and anticipated pain and difficulty for endometrial biopsy, declines at this time. Aware the uterus will be sent to pathology and can evaluate lining at that point.   Pelvic US  demonstrates FINDINGS: Uterus   Measurements: 9.2 x 6 x 6.8 cm = volume: 196 mL. Heterogeneity of the myometrium with multiple predominantly hypoechoic masses. The largest is a submucosal mass posteriorly measuring 3.4 x 2.9 x 2.5 cm.   Endometrium   Thickness: 3 mm. Hypoechoic material splaying the endometrial stripe, possibly reflecting blood products. Nodular structure in the endometrium measuring 1.4 x 0.7 x 1.3 cm, possibly representing a pedunculated fibroid or endometrial polyp.   Right ovary   Measurements: 3.1 x 2 x 2.6 cm = volume: 8.3 mL. Normal appearance/no adnexal mass.   Left ovary   Measurements: 2.7 x 1.2 x 1.3 cm = volume: 2.2 mL. Normal appearance/no adnexal mass.   Other findings   No free pelvic fluid.   IMPRESSION: 1. Heterogeneity of the myometrium, which can be seen in the setting of adenomyosis. There are multiple uterine fibroids, the largest being a submucosal fibroid in the posterior uterine body, measuring 3.4 x 2.9 x 2.5 cm. 2. Nodular structure in the endometrium measuring 1.4 x 0.7 x 1.3 cm, possibly representing a pedunculated fibroid, endometrial polyp, or blood clot. Hypoechoic material splaying the endometrial cavity is also present, possibly reflecting blood products. 3. Normal ovaries.   These results will be called to the ordering clinician or representative by the Radiologist Assistant and communication documented in the PACS or [employer].     Electronically Signed   By: Rogelia Myers M.D.   On: 04/27/2024 11:31    All questions answered. Will proceed with hysterectomy for dysmenorrhea, AUB-L.     Carter Quarry,  MD, FACOG Minimally Invasive Gynecologic Surgery  Obstetrics and Gynecology, [facility] for [facility], [facility] 04/27/2024

## 2024-04-27 NOTE — Anesthesia Procedure Notes (Signed)
 Procedure Name: Intubation Date/Time: 04/27/2024 1:09 PM  Performed by: Dianne Burnard RAMAN, CRNAPre-anesthesia Checklist: Patient identified, Patient being monitored, Timeout performed, Emergency Drugs available and Suction available Patient Re-evaluated:Patient Re-evaluated prior to induction Oxygen Delivery Method: Circle system utilized Preoxygenation: Pre-oxygenation with 100% oxygen Induction Type: IV induction Ventilation: Mask ventilation without difficulty Laryngoscope Size: 3 and McGrath Grade View: Grade I Tube type: Oral Tube size: 7.5 mm Number of attempts: 1 Airway Equipment and Method: Stylet Placement Confirmation: ETT inserted through vocal cords under direct vision, positive ETCO2 and breath sounds checked- equal and bilateral Secured at: 21 cm Tube secured with: Tape Dental Injury: Teeth and Oropharynx as per pre-operative assessment  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

## 2024-04-28 ENCOUNTER — Encounter (HOSPITAL_COMMUNITY): Payer: Self-pay | Admitting: Obstetrics and Gynecology

## 2024-04-28 ENCOUNTER — Other Ambulatory Visit (HOSPITAL_COMMUNITY): Payer: Self-pay

## 2024-04-28 DIAGNOSIS — N809 Endometriosis, unspecified: Secondary | ICD-10-CM

## 2024-04-28 DIAGNOSIS — N946 Dysmenorrhea, unspecified: Secondary | ICD-10-CM | POA: Diagnosis not present

## 2024-04-28 DIAGNOSIS — D219 Benign neoplasm of connective and other soft tissue, unspecified: Secondary | ICD-10-CM

## 2024-04-28 MED ORDER — ACETAMINOPHEN 500 MG PO TABS: 1000.0000 mg | ORAL_TABLET | Freq: Four times a day (QID) | ORAL | Status: AC

## 2024-04-28 NOTE — Plan of Care (Signed)

## 2024-04-28 NOTE — Discharge Summary (Signed)
 Gynecology Physician Postoperative Discharge Summary  Patient ID: Mindy Brown MRN: [id number] DOB/AGE: [birth year] 48 y.o.  Admit Date: 04/27/2024 Discharge Date: 04/28/2024  Preoperative Diagnoses: dysmenorrhea Endometriosis Abnormal uterine bleeding  Procedures: Procedure(s) (LRB): HYSTERECTOMY, TOTAL, ROBOT-ASSISTED (N/A) CYSTOSCOPY (N/A) LYSIS, ADHESIONS, ROBOT-ASSISTED, LAPAROSCOPIC  Hospital Course:  Mindy Brown is a 48 y.o. [id number]  admitted for scheduled surgery.  She underwent the procedures as mentioned above, her operation was uncomplicated. For further details about surgery, please refer to the operative report. Patient had an uncomplicated postoperative course. Pain this morning slightly more intense but overall doing well. Notes having some initial tingling in both lower extremities, left > right closer to knee but improving, no pain with movement and no swelling. By time of discharge on POD#1, her pain was controlled; she was ambulating, voiding without difficulty, and tolerating regular diet. She was deemed stable for discharge to home.   Significant Labs:    Latest Ref Rng & Units 04/26/2024    9:27 AM 07/26/2022    9:48 PM 06/07/2022    1:03 PM  CBC  WBC 4.0 - 10.5 K/uL 5.6  4.7  4.8   Hemoglobin 12.0 - 15.0 g/dL 89.4  89.4  89.3   Hematocrit 36.0 - 46.0 % 31.7  32.3  32.4   Platelets 150 - 400 K/uL 293  271  269     Discharge Exam: Blood pressure (!) 116/57, pulse 82, temperature 98.4 F (36.9 C), temperature source Oral, resp. rate 17, height 5' 2 (1.575 m), weight 72.6 kg, last menstrual period 01/28/2024, SpO2 100%. General appearance: alert and no distress  Resp: clear to auscultation bilaterally  Cardio: regular rate and rhythm  GI: soft, non-tender; bowel sounds normal; no masses, no organomegaly.  Incision: C/D/I, no erythema, no drainage noted Pelvic: scant blood on pad (done in presence of RN as chaperone)  Extremities: extremities  normal, atraumatic, no cyanosis or edema and Homans sign is negative, no sign of DVT, slight discomfort of left anterolateral mid calf   Discharged Condition: Stable  Disposition:    Allergies as of 04/28/2024       Reactions   Latex Rash   And skin peels        Medication List     STOP taking these medications    Aurovela 1/20 1-20 MG-MCG tablet Generic drug: norethindrone-ethinyl estradiol       TAKE these medications    acetaminophen  500 MG tablet Commonly known as: TYLENOL  Take 2 tablets (1,000 mg total) by mouth every 6 (six) hours.   aspirin-acetaminophen -caffeine 250-250-65 MG tablet Commonly known as: EXCEDRIN MIGRAINE Take 1 tablet by mouth every 6 (six) hours as needed for headache or migraine.   cyanocobalamin  1000 MCG tablet Commonly known as: VITAMIN B12 Take 1,000 mcg by mouth daily.   EXCEDRIN ASPIRIN FREE PO Take 1 tablet by mouth as needed.   ibuprofen 800 MG tablet Commonly known as: ADVIL Take 1 tablet (800 mg total) by mouth 3 (three) times daily with meals as needed for headache, moderate pain (pain score 4-6) or cramping.   levothyroxine 50 MCG tablet Commonly known as: SYNTHROID Take 50 mcg by mouth daily.   nitrofurantoin (macrocrystal-monohydrate) 100 MG capsule Commonly known as: MACROBID Take 100 mg by mouth 2 (two) times daily.   OVER THE COUNTER MEDICATION Take by mouth daily. SourSop Bitter oil solution---  2 tablespoon   oxyCODONE  5 MG immediate release tablet Commonly known as: Oxy IR/ROXICODONE  Take 1 tablet (5 mg total) by  mouth every 4 (four) hours as needed for severe pain (pain score 7-10) or breakthrough pain.   polyethylene glycol powder 17 GM/SCOOP powder Commonly known as: MiraLax Dissolve 1 capful (17g) in 4-8 ounces of liquid and take by mouth daily.   Probiotic Daily Caps Take 2 capsules by mouth daily.   VITA-C PO Take 1 tablet by mouth daily as needed.   Vitamin D  (Ergocalciferol ) 1.25 MG (50000  UNIT) Caps capsule Commonly known as: DRISDOL  Take 50,000 Units by mouth every 7 (seven) days. Friday's   ZINC PO Take 1 tablet by mouth at bedtime as needed.       No future appointments.   Total discharge time: 15 minutes   Signed:  Carter Quarry, MD Minimally Invasive Gynecologic Surgery and Chronic Pelvic Pain Specialist Obstetrics and Gynecology, [facility] for [facility], [facility] 04/28/24

## 2024-04-28 NOTE — Anesthesia Postprocedure Evaluation (Signed)
 Anesthesia Post Note  Patient: Mindy Brown  Procedure(s) Performed: HYSTERECTOMY, TOTAL, ROBOT-ASSISTED (Pelvis) CYSTOSCOPY (Bladder) LYSIS, ADHESIONS, ROBOT-ASSISTED, LAPAROSCOPIC (Pelvis)     Patient location during evaluation: PACU Anesthesia Type: General Level of consciousness: awake Pain management: pain level controlled Vital Signs Assessment: post-procedure vital signs reviewed and stable Respiratory status: spontaneous breathing, nonlabored ventilation and respiratory function stable Cardiovascular status: blood pressure returned to baseline and stable Postop Assessment: no apparent nausea or vomiting Anesthetic complications: no   No notable events documented.               [name]

## 2024-04-29 ENCOUNTER — Other Ambulatory Visit (HOSPITAL_COMMUNITY): Payer: Self-pay

## 2024-04-29 LAB — SURGICAL PATHOLOGY

## 2024-04-30 ENCOUNTER — Ambulatory Visit: Payer: Self-pay | Admitting: Obstetrics and Gynecology

## 2024-05-17 NOTE — Progress Notes (Unsigned)
   POSTOPERATIVE VISIT NOTE   Subjective:     Mindy Brown is a 48 y.o. [id number] who presents to the clinic 3 weeks status post robotic assisted total laparoscopic hysterectomy, lysis of adhesions, and cystoscopy for abnormal uterine bleeding, fibroids, and pelvic pain. Eating a regular diet {with-without:5700} difficulty. Bowel movements are {normal/abnormal***:19619}. {pain control:13522::The patient is not having any pain.} Incision: *** Vaginal bleeding: *** Resumed sexual acitivity:   {Common ambulatory SmartLinks:19316}.   Review of Systems {ros; complete:30496}    Objective:    LMP 01/28/2024 (Approximate)  General:  {gen appearance:16600}  Abdomen: {post op abd exam:13497::soft,bowel sounds active,non-tender}  Incision:   {incision:13716::healing well,no drainage,no dehiscence,no erythema,no hernia,no seroma,incision well approximated,no swelling}  Pelvic:   {Exam; pelvic:30843}    Pathology Results: FINAL MICROSCOPIC DIAGNOSIS:   A. UTERUS AND CERVIX, HYSTERECTOMY:  - Serosal fibrous adhesions and endometriosis.  - Cervix with Nabothian cysts.  - Endometrium with changes consistent with prior ablation.  - Myometrium with adenomyosis and leiomyomata, largest measuring 0.8 cm.  - Unremarkable proximal segment of fallopian tube; status post tubal  ligation.  - Negative for malignancy.    Assessment:   {doing well:13525::Doing well postoperatively.} Operative findings again reviewed. Pathology report discussed.   Plan:    There are no diagnoses linked to this encounter.  Activity restrictions: {restrictions:13723} Anticipated return to work: {work return:14002}. Follow up: ***  Carter Quarry, MD Obstetrician & Gynecologist, [facility] for [facility], [facility]

## 2024-05-19 ENCOUNTER — Encounter: Payer: Self-pay | Admitting: Obstetrics and Gynecology

## 2024-05-19 ENCOUNTER — Ambulatory Visit: Admitting: Obstetrics and Gynecology

## 2024-05-19 VITALS — BP 150/86 | HR 85 | Ht 62.0 in | Wt 160.0 lb

## 2024-05-19 DIAGNOSIS — N946 Dysmenorrhea, unspecified: Secondary | ICD-10-CM

## 2024-05-19 DIAGNOSIS — Z09 Encounter for follow-up examination after completed treatment for conditions other than malignant neoplasm: Secondary | ICD-10-CM

## 2024-05-19 DIAGNOSIS — N939 Abnormal uterine and vaginal bleeding, unspecified: Secondary | ICD-10-CM

## 2024-05-19 DIAGNOSIS — R1013 Epigastric pain: Secondary | ICD-10-CM

## 2024-05-19 DIAGNOSIS — N809 Endometriosis, unspecified: Secondary | ICD-10-CM

## 2024-05-19 NOTE — Progress Notes (Signed)
 48 y.o. GYN presents for Post Op Follow -up. C/o burning feeling in the middle of her abdomin.

## 2024-05-24 ENCOUNTER — Encounter (HOSPITAL_BASED_OUTPATIENT_CLINIC_OR_DEPARTMENT_OTHER): Payer: Self-pay | Admitting: Obstetrics & Gynecology

## 2024-06-28 ENCOUNTER — Ambulatory Visit: Admitting: Obstetrics and Gynecology

## 2024-06-28 VITALS — BP 154/78 | HR 60 | Ht 62.0 in | Wt 158.1 lb

## 2024-06-28 DIAGNOSIS — Z09 Encounter for follow-up examination after completed treatment for conditions other than malignant neoplasm: Secondary | ICD-10-CM

## 2024-06-28 DIAGNOSIS — R399 Unspecified symptoms and signs involving the genitourinary system: Secondary | ICD-10-CM

## 2024-06-28 LAB — POCT URINALYSIS DIPSTICK
Blood, UA: NEGATIVE
Glucose, UA: NEGATIVE
Ketones, UA: NEGATIVE
Leukocytes, UA: NEGATIVE
Nitrite, UA: NEGATIVE
Protein, UA: NEGATIVE
Spec Grav, UA: 1.01
Urobilinogen, UA: 0.2 U/dL
pH, UA: 5

## 2024-06-28 NOTE — Progress Notes (Addendum)
" ° °  ESTABLISHED GYNECOLOGY VISIT Chief Complaint  Patient presents with   Follow-up    Post op    Subjective:  Mindy Brown is a 49 y.o. [id number] presenting for postop visit s/p RA-TLH with Dr. Jeralyn on 04/28/24  Doing well, here for postop vaginal cuff check. Reports pressure near bladder, requests UA. No burning with urination No vaginal bleeding or abnormal discharge  Review of Systems:   Pertinent items are noted in HPI  Pertinent History Reviewed:  Reviewed past medical,surgical, social and family history.  Reviewed problem list, medications and allergies.  Objective:   Vitals:   06/28/24 0946 06/28/24 0950  BP: (!) 157/93 (!) 154/78  Pulse: 60 60  Weight: 158 lb 1.9 oz (71.7 kg)   Height: 5' 2 (1.575 m)    Physical Examination:   General appearance - well appearing, and in no distress  Mental status - alert, oriented to person, place, and time  Psych:  normal mood and affect  Skin - warm and dry, normal color, no suspicious lesions noted  Abdomen - soft, nontender, nondistended, no masses or organomegaly  Laparoscopic incisions clean/dry/intact  Pelvic -  VULVA: normal appearing vulva with no masses, tenderness or lesions   VAGINA: normal appearing vagina with normal color and discharge, no lesions, cuff appears intact CERVIX: surgically absent   UTERUS: surgically absent   ADNEXA: No adnexal masses or tenderness noted.   Vaginal cuff intact on bimanual exam  Extremities:  No swelling or varicosities noted  Chaperone present for exam  Assessment and Plan:  1. Postop check (Primary) Doing well, good recovery Ok to return to usual activities, confirmed with Dr. Jeralyn Otherwise routine annual wellness visits or f/u prn   2. Urinary symptom or sign UA neg - POCT Urinalysis Dipstick  No follow-ups on file.  No future appointments.  Rollo ONEIDA Bring, MD, FACOG Obstetrician & Gynecologist, [facility] for [facility], [facility] "
